# Patient Record
Sex: Male | Born: 1955
Health system: Southern US, Community
[De-identification: ages and names within clinical notes are randomized; demographics above are authoritative.]

## PROBLEM LIST (undated history)

## (undated) DIAGNOSIS — I10 Essential (primary) hypertension: Secondary | ICD-10-CM

## (undated) DIAGNOSIS — G4730 Sleep apnea, unspecified: Secondary | ICD-10-CM

## (undated) DIAGNOSIS — T4145XA Adverse effect of unspecified anesthetic, initial encounter: Secondary | ICD-10-CM

## (undated) DIAGNOSIS — M1990 Unspecified osteoarthritis, unspecified site: Secondary | ICD-10-CM

## (undated) DIAGNOSIS — B029 Zoster without complications: Secondary | ICD-10-CM

## (undated) DIAGNOSIS — G96198 Other disorders of meninges, not elsewhere classified: Secondary | ICD-10-CM

## (undated) DIAGNOSIS — J45909 Unspecified asthma, uncomplicated: Secondary | ICD-10-CM

## (undated) DIAGNOSIS — G9619 Other disorders of meninges, not elsewhere classified: Secondary | ICD-10-CM

## (undated) DIAGNOSIS — G4720 Circadian rhythm sleep disorder, unspecified type: Secondary | ICD-10-CM

## (undated) DIAGNOSIS — R112 Nausea with vomiting, unspecified: Secondary | ICD-10-CM

## (undated) DIAGNOSIS — Z9889 Other specified postprocedural states: Secondary | ICD-10-CM

## (undated) DIAGNOSIS — G039 Meningitis, unspecified: Secondary | ICD-10-CM

## (undated) DIAGNOSIS — T8859XA Other complications of anesthesia, initial encounter: Secondary | ICD-10-CM

## (undated) HISTORY — DX: Meningitis, unspecified: G03.9

## (undated) HISTORY — PX: OTHER SURGICAL HISTORY: SHX169

## (undated) HISTORY — DX: Essential (primary) hypertension: I10

## (undated) HISTORY — DX: Other disorders of meninges, not elsewhere classified: G96.19

## (undated) HISTORY — PX: JOINT REPLACEMENT: SHX530

## (undated) HISTORY — DX: Circadian rhythm sleep disorder, unspecified type: G47.20

## (undated) HISTORY — DX: Other disorders of meninges, not elsewhere classified: G96.198

## (undated) HISTORY — DX: Unspecified osteoarthritis, unspecified site: M19.90

## (undated) HISTORY — PX: FRACTURE SURGERY: SHX138

## (undated) HISTORY — DX: Zoster without complications: B02.9

---

## 2002-11-20 ENCOUNTER — Ambulatory Visit (HOSPITAL_BASED_OUTPATIENT_CLINIC_OR_DEPARTMENT_OTHER): Admission: RE | Admit: 2002-11-20 | Discharge: 2002-11-20 | Payer: Self-pay | Admitting: Orthopedic Surgery

## 2005-03-09 ENCOUNTER — Ambulatory Visit: Payer: Self-pay | Admitting: Family Medicine

## 2005-04-25 ENCOUNTER — Ambulatory Visit: Payer: Self-pay | Admitting: Family Medicine

## 2005-05-01 ENCOUNTER — Ambulatory Visit: Payer: Self-pay | Admitting: Family Medicine

## 2005-05-31 ENCOUNTER — Ambulatory Visit: Payer: Self-pay | Admitting: Internal Medicine

## 2005-06-08 ENCOUNTER — Ambulatory Visit: Payer: Self-pay | Admitting: Internal Medicine

## 2005-06-08 ENCOUNTER — Encounter (INDEPENDENT_AMBULATORY_CARE_PROVIDER_SITE_OTHER): Payer: Self-pay | Admitting: Specialist

## 2006-01-01 ENCOUNTER — Ambulatory Visit: Payer: Self-pay | Admitting: Family Medicine

## 2006-04-05 ENCOUNTER — Ambulatory Visit: Payer: Self-pay | Admitting: Family Medicine

## 2006-04-05 LAB — CONVERTED CEMR LAB
AST: 18 units/L (ref 0–37)
Albumin: 4.5 g/dL (ref 3.5–5.2)
Alkaline Phosphatase: 72 units/L (ref 39–117)
BUN: 9 mg/dL (ref 6–23)
Calcium: 9.4 mg/dL (ref 8.4–10.5)
Chol/HDL Ratio, serum: 3.9
Cholesterol: 183 mg/dL (ref 0–200)
GFR calc non Af Amer: 83 mL/min
Hemoglobin: 15 g/dL (ref 13.0–17.0)
MCHC: 33.3 g/dL (ref 30.0–36.0)
Monocytes Relative: 8.2 % (ref 3.0–11.0)
Neutrophils Relative %: 72.1 % (ref 43.0–77.0)
Platelets: 325 10*3/uL (ref 150–400)
Potassium: 4.8 meq/L (ref 3.5–5.1)
RBC: 5.07 M/uL (ref 4.22–5.81)
RDW: 12.5 % (ref 11.5–14.6)
Total Protein: 6.8 g/dL (ref 6.0–8.3)

## 2006-04-16 ENCOUNTER — Ambulatory Visit: Payer: Self-pay | Admitting: Family Medicine

## 2006-05-07 ENCOUNTER — Ambulatory Visit: Payer: Self-pay | Admitting: Family Medicine

## 2006-09-11 ENCOUNTER — Ambulatory Visit: Payer: Self-pay | Admitting: Family Medicine

## 2007-03-26 ENCOUNTER — Ambulatory Visit: Payer: Self-pay | Admitting: Family Medicine

## 2007-03-26 LAB — CONVERTED CEMR LAB
ALT: 28 units/L (ref 0–53)
AST: 23 units/L (ref 0–37)
BUN: 11 mg/dL (ref 6–23)
Basophils Absolute: 0 10*3/uL (ref 0.0–0.1)
Bilirubin, Direct: 0.2 mg/dL (ref 0.0–0.3)
CO2: 32 meq/L (ref 19–32)
Calcium: 9.2 mg/dL (ref 8.4–10.5)
Cholesterol: 216 mg/dL (ref 0–200)
Creatinine, Ser: 1 mg/dL (ref 0.4–1.5)
GFR calc non Af Amer: 83 mL/min
HDL: 45 mg/dL (ref >39.0)
Hemoglobin: 14.6 g/dL (ref 13.0–17.0)
Ketones, urine, test strip: NEGATIVE
Lymphocytes Relative: 29.6 % (ref 12.0–46.0)
Monocytes Absolute: 0.6 10*3/uL (ref 0.2–0.7)
Neutro Abs: 3.1 10*3/uL (ref 1.4–7.7)
Neutrophils Relative %: 57.3 % (ref 43.0–77.0)
Nitrite: NEGATIVE
RBC: 4.65 M/uL (ref 4.22–5.81)
Total Bilirubin: 0.7 mg/dL (ref 0.3–1.2)
Total CHOL/HDL Ratio: 4.8
VLDL: 29 mg/dL (ref 0–40)
pH: 7

## 2007-04-02 ENCOUNTER — Telehealth: Payer: Self-pay | Admitting: Family Medicine

## 2007-04-02 ENCOUNTER — Ambulatory Visit: Payer: Self-pay | Admitting: Family Medicine

## 2007-04-02 DIAGNOSIS — G4700 Insomnia, unspecified: Secondary | ICD-10-CM | POA: Insufficient documentation

## 2007-04-09 ENCOUNTER — Ambulatory Visit: Payer: Self-pay | Admitting: Family Medicine

## 2007-08-19 ENCOUNTER — Telehealth (INDEPENDENT_AMBULATORY_CARE_PROVIDER_SITE_OTHER): Payer: Self-pay | Admitting: *Deleted

## 2007-12-10 ENCOUNTER — Telehealth: Payer: Self-pay | Admitting: Family Medicine

## 2008-04-28 ENCOUNTER — Ambulatory Visit: Payer: Self-pay | Admitting: Family Medicine

## 2008-05-25 ENCOUNTER — Ambulatory Visit: Payer: Self-pay | Admitting: Family Medicine

## 2008-05-25 DIAGNOSIS — I10 Essential (primary) hypertension: Secondary | ICD-10-CM | POA: Insufficient documentation

## 2008-10-15 ENCOUNTER — Telehealth: Payer: Self-pay | Admitting: Family Medicine

## 2008-11-13 ENCOUNTER — Telehealth: Payer: Self-pay | Admitting: *Deleted

## 2009-06-21 ENCOUNTER — Ambulatory Visit: Payer: Self-pay | Admitting: Family Medicine

## 2009-06-21 LAB — CONVERTED CEMR LAB
ALT: 27 units/L (ref 0–53)
AST: 22 units/L (ref 0–37)
Albumin: 4.4 g/dL (ref 3.5–5.2)
Alkaline Phosphatase: 66 units/L (ref 39–117)
BUN: 12 mg/dL (ref 6–23)
Bilirubin Urine: NEGATIVE
Chloride: 105 meq/L (ref 96–112)
Eosinophils Absolute: 0.1 10*3/uL (ref 0.0–0.7)
Glucose, Bld: 88 mg/dL (ref 70–99)
HCT: 43.7 % (ref 39.0–52.0)
HDL: 49.7 mg/dL (ref >39.00)
Hemoglobin: 14.5 g/dL (ref 13.0–17.0)
MCV: 92.2 fL (ref 78.0–100.0)
Monocytes Absolute: 0.5 10*3/uL (ref 0.1–1.0)
Neutrophils Relative %: 62.2 % (ref 43.0–77.0)
PSA: 1.06 ng/mL (ref 0.10–4.00)
Protein, U semiquant: NEGATIVE
RBC: 4.74 M/uL (ref 4.22–5.81)
Specific Gravity, Urine: 1.02
TSH: 0.92 microintl units/mL (ref 0.35–5.50)
Total Bilirubin: 1 mg/dL (ref 0.3–1.2)
Total CHOL/HDL Ratio: 4
Total Protein: 7.1 g/dL (ref 6.0–8.3)
Triglycerides: 157 mg/dL — ABNORMAL HIGH (ref 0.0–149.0)
VLDL: 31.4 mg/dL (ref 0.0–40.0)
pH: 6

## 2009-07-28 ENCOUNTER — Encounter (HOSPITAL_COMMUNITY): Admission: RE | Admit: 2009-07-28 | Discharge: 2009-09-28 | Payer: Self-pay | Admitting: Neurological Surgery

## 2009-08-03 ENCOUNTER — Ambulatory Visit: Payer: Self-pay | Admitting: Family Medicine

## 2009-08-20 ENCOUNTER — Ambulatory Visit: Payer: Self-pay | Admitting: Family Medicine

## 2009-08-25 ENCOUNTER — Telehealth: Payer: Self-pay | Admitting: Family Medicine

## 2009-09-10 ENCOUNTER — Telehealth: Payer: Self-pay | Admitting: Family Medicine

## 2010-02-24 ENCOUNTER — Telehealth: Payer: Self-pay | Admitting: Family Medicine

## 2010-05-02 ENCOUNTER — Ambulatory Visit: Payer: Self-pay | Admitting: Family Medicine

## 2010-06-07 ENCOUNTER — Encounter: Payer: Self-pay | Admitting: Internal Medicine

## 2010-06-28 NOTE — Progress Notes (Signed)
Summary: celexa refill  Phone Note Refill Request Message from:  Fax from Pharmacy on February 24, 2010 3:56 PM  Refills Requested: Medication #1:  CELEXA 20 MG  TABS take one and a half tablet daily Initial call taken by: Kern Reap CMA Duncan Dull),  February 24, 2010 3:56 PM    Prescriptions: CELEXA 20 MG  TABS (CITALOPRAM HYDROBROMIDE) take one and a half tablet daily  #100 x 2   Entered by:   Kern Reap CMA (AAMA)   Authorized by:   Roderick Pee MD   Signed by:   Kern Reap CMA (AAMA) on 02/24/2010   Method used:   Electronically to        [facility]  [phone]* (retail)       [address]       [place], [place]  [address]       Ph: [phone] or [phone]       Fax: [phone]   RxID:   [id number]

## 2010-06-28 NOTE — Assessment & Plan Note (Signed)
Summary: MED CHECK/REFILL/CJR   Vital Signs:  Patient profile:   55 year old male Height:      69.25 inches Weight:      208 pounds BMI:     30.61 Temp:     98.8 degrees F oral BP sitting:   110 / 80  (left arm) Cuff size:   regular  Vitals Entered By: Kern Reap CMA Duncan Dull) (June 21, 2009 11:32 AM)  Reason for Visit follow up meds  History of Present Illness: John Bowen is a 55 year old male, who comes in today for evaluation of back pain.  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  He had 3 compression fractures in his thoracic spine.  At the time he was evaluated at [facility].  A couple years ago he began having upper back pain.  Over the last year.  It is gotten worse.  He had an evaluation in [facility] office.  Plain x-rays were normal and he was prescribed to physical therapy.  The physical therapy did not help.  Therefore, he went back for follow-up.  He had an MRI, which showed no evidence of any disk problems.  However, he has some benign hemangiomas in that area.  He was told that they were benign and not the cause of his discomfort.  He went to see a, chiropractor who's been doing stretching etc. without relief.  He describes the pain is intermittent.  It is a dull ache sometimes on a scale of one to 10.  It is 7 or 8 no radiation.  Neurologic review of systems otherwise negative  Allergies (verified): No Known Drug Allergies  Past History:  Past medical, surgical, family and social histories (including risk factors) reviewed, and no changes noted (except as noted below).  Past Medical History: Reviewed history from 05/25/2008 and no changes required. sleep dysfunction degenerative joint disease right left knee meningitis 1995 Hypertension  Family History: Reviewed history from 04/02/2007 and no changes required. Family History Hypertension  Social History: Reviewed history from 04/02/2007 and no changes required. Married Never Smoked Alcohol  use-yes Drug use-no Regular exercise-yes  Review of Systems      See HPI  Physical Exam  General:  Well-developed,well-nourished,in no acute distress; alert,appropriate and cooperative throughout examination   Impression & Recommendations:  Problem # 1:  BACK PAIN, THORACIC REGION (ICD-724.1) Assessment New  Orders: Venipuncture (54098) TLB-Lipid Panel (80061-LIPID) TLB-BMP (Basic Metabolic Panel-BMET) (80048-METABOL) TLB-CBC Platelet - w/Differential (85025-CBCD) TLB-Hepatic/Liver Function Pnl (80076-HEPATIC) TLB-TSH (Thyroid Stimulating Hormone) (84443-TSH) TLB-PSA (Prostate Specific Antigen) (84153-PSA) Neurosurgeon Referral (Neurosurgeon)  Complete Medication List: 1)  Celexa 20 Mg Tabs (Citalopram hydrobromide) .... Take one and a half tablet daily 2)  Zestril 20 Mg Tabs (Lisinopril) .... Take 1 tablet by mouth every morning  Other Orders: UA Dipstick w/o Micro (automated)  (81003)  Patient Instructions: 1)  begin Motrin 600 mg twice a day with food. 2)  I will request a formal consult with Dr. Barnett Abu neurosurgeon 3)  after your lab work set up an appointment sometime in the next two to 4 weeks for 30 minutes for you general medical exam Prescriptions: ZESTRIL 20 MG TABS (LISINOPRIL) Take 1 tablet by mouth every morning  #100 x 3   Entered and Authorized by:   Roderick Pee MD   Signed by:   Roderick Pee MD on 06/21/2009   Method used:   Print then Give to Patient   RxID:   [id number] CELEXA 20 MG  TABS (CITALOPRAM  HYDROBROMIDE) take one and a half tablet daily  #100 x 3   Entered and Authorized by:   Roderick Pee MD   Signed by:   Roderick Pee MD on 06/21/2009   Method used:   Print then Give to Patient   RxID:   [id number]   Laboratory Results   Urine Tests    Routine Urinalysis   Color: yellow Appearance: Clear Glucose: negative   (Normal Range: Negative) Bilirubin: negative   (Normal Range: Negative) Ketone: negative    (Normal Range: Negative) Spec. Gravity: 1.020   (Normal Range: 1.003-1.035) Blood: negative   (Normal Range: Negative) pH: 6.0   (Normal Range: 5.0-8.0) Protein: negative   (Normal Range: Negative) Urobilinogen: 0.2   (Normal Range: 0-1) Nitrite: negative   (Normal Range: Negative) Leukocyte Esterace: negative   (Normal Range: Negative)    Comments: Rita Ohara  June 21, 2009 1:38 PM

## 2010-06-28 NOTE — Assessment & Plan Note (Signed)
Summary: FLU-LIKE SXS // RS   Vital Signs:  Patient profile:   55 year old male Weight:      202 pounds BMI:     29.72 Temp:     99.3 degrees F oral BP sitting:   128 / 100  (left arm) Cuff size:   regular  Vitals Entered By: Raechel Ache, RN (August 20, 2009 1:08 PM) CC: C/o sick x 1 week. Burned mouth 1 week ago on hot food- mouth ulcerated, sore throat, ears and eyes full, prod cough and muscles sore from coughing.   History of Present Illness: Here for one week of sinus prssure, HA, PND, ST, dry coughing, and low grade fevers. On fluids.   Allergies (verified): No Known Drug Allergies  Past History:  Past Medical History: Reviewed history from 05/25/2008 and no changes required. sleep dysfunction degenerative joint disease right left knee meningitis 1995 Hypertension  Review of Systems  The patient denies anorexia, weight loss, weight gain, vision loss, decreased hearing, hoarseness, chest pain, syncope, dyspnea on exertion, peripheral edema, hemoptysis, abdominal pain, melena, hematochezia, severe indigestion/heartburn, hematuria, incontinence, genital sores, muscle weakness, suspicious skin lesions, transient blindness, difficulty walking, depression, unusual weight change, abnormal bleeding, enlarged lymph nodes, angioedema, breast masses, and testicular masses.    Physical Exam  General:  Well-developed,well-nourished,in no acute distress; alert,appropriate and cooperative throughout examination Head:  Normocephalic and atraumatic without obvious abnormalities. No apparent alopecia or balding. Eyes:  No corneal or conjunctival inflammation noted. EOMI. Perrla. Funduscopic exam benign, without hemorrhages, exudates or papilledema. Vision grossly normal. Ears:  External ear exam shows no significant lesions or deformities.  Otoscopic examination reveals clear canals, tympanic membranes are intact bilaterally without bulging, retraction, inflammation or discharge.  Hearing is grossly normal bilaterally. Nose:  External nasal examination shows no deformity or inflammation. Nasal mucosa are pink and moist without lesions or exudates. Mouth:  Oral mucosa and oropharynx without lesions or exudates.  Teeth in good repair. Neck:  No deformities, masses, or tenderness noted. Lungs:  Normal respiratory effort, chest expands symmetrically. Lungs are clear to auscultation, no crackles or wheezes.   Impression & Recommendations:  Problem # 1:  ACUTE SINUSITIS, UNSPECIFIED (ICD-461.9)  His updated medication list for this problem includes:    Zithromax Z-pak 250 Mg Tabs (Azithromycin) .Marland Kitchen... As directed    Hydromet 5-1.5 Mg/2ml Syrp (Hydrocodone-homatropine) .Marland Kitchen... 1 tsp q 4 hours as needed cough  Complete Medication List: 1)  Celexa 20 Mg Tabs (Citalopram hydrobromide) .... Take one and a half tablet daily 2)  Zestril 20 Mg Tabs (Lisinopril) .... Take 1 tablet by mouth every morning 3)  Zithromax Z-pak 250 Mg Tabs (Azithromycin) .... As directed 4)  Hydromet 5-1.5 Mg/41ml Syrp (Hydrocodone-homatropine) .Marland Kitchen.. 1 tsp q 4 hours as needed cough  Patient Instructions: 1)  Please schedule a follow-up appointment as needed .  Prescriptions: HYDROMET 5-1.5 MG/5ML SYRP (HYDROCODONE-HOMATROPINE) 1 tsp q 4 hours as needed cough  #240 x 0   Entered and Authorized by:   Nelwyn Salisbury MD   Signed by:   Nelwyn Salisbury MD on 08/20/2009   Method used:   Print then Give to Patient   RxID:   [id number] ZITHROMAX Z-PAK 250 MG TABS (AZITHROMYCIN) as directed  #1 x 0   Entered and Authorized by:   Nelwyn Salisbury MD   Signed by:   Nelwyn Salisbury MD on 08/20/2009   Method used:   Print then Give to Patient   RxID:  [id number]  

## 2010-06-28 NOTE — Assessment & Plan Note (Signed)
Summary: 4-5 wk follow up/cjr/pt rsc/cjr   Vital Signs:  Patient profile:   55 year old male Weight:      207 pounds Temp:     99.4 degrees F oral BP sitting:   130 / 80  (left arm) Cuff size:   regular  Vitals Entered By: Kern Reap CMA Duncan Dull) (August 03, 2009 3:27 PM)  Reason for Visit follow up office visit  History of Present Illness: Jesusita Oka is a 55 year old, married male, nonsmoker comes back today for follow-up of mood changes, hypertension, back pain.  He is on Celexa 30 mg nightly sleeping well.  Would like to continue that medication.  He also takes Zestril 20 mg daily BP 130/80.  Mild cough, which is not to change his medicine.  He went to see Dr. Danielle Dess for evaluation of the hemangiomas in his back.  They did scan and he stated he go back for follow-up on the 18th of March.  Allergies (verified): No Known Drug Allergies  Past History:  Past medical, surgical, family and social histories (including risk factors) reviewed for relevance to current acute and chronic problems.  Past Medical History: Reviewed history from 05/25/2008 and no changes required. sleep dysfunction degenerative joint disease right left knee meningitis 1995 Hypertension  Family History: Reviewed history from 04/02/2007 and no changes required. Family History Hypertension  Social History: Reviewed history from 04/02/2007 and no changes required. Married Never Smoked Alcohol use-yes Drug use-no Regular exercise-yes  Review of Systems      See HPI  Physical Exam  General:  Well-developed,well-nourished,in no acute distress; alert,appropriate and cooperative throughout examination Psych:  Cognition and judgment appear intact. Alert and cooperative with normal attention span and concentration. No apparent delusions, illusions, hallucinations   Impression & Recommendations:  Problem # 1:  HYPERTENSION (ICD-401.9) Assessment Improved  His updated medication list for this problem  includes:    Zestril 20 Mg Tabs (Lisinopril) .Marland Kitchen... 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 Take one and a half tablet daily 2)  Zestril 20 Mg Tabs (Lisinopril) .... Take 1 tablet by mouth every morning  Patient Instructions: 1)  continue current medication.  Return for your annual physical examination when you are due

## 2010-06-28 NOTE — Assessment & Plan Note (Signed)
Summary: flu/fever, cough , mylagias/dm   Vital Signs:  Patient profile:   55 year old male Weight:      209 pounds Temp:     98.7 degrees F oral BP sitting:   130 / 90  (left arm) Cuff size:   regular  Vitals Entered By: Kern Reap CMA Duncan Dull) (May 02, 2010 5:20 PM) CC: fever, body aches, chills, congestion, cough   CC:  fever, body aches, chills, congestion, and cough.  History of Present Illness: John Bowen is a 55 year old male, who comes in today for evaluation of a 3 day history of fever, chills, aching all over, and cough.  Review of systems negative.  He did not get the flu shot this year  Allergies: No Known Drug Allergies  Past History:  Past medical, surgical, family and social histories (including risk factors) reviewed, and no changes noted (except as noted below). Past medical, surgical, family and social histories (including risk factors) reviewed for relevance to current acute and chronic problems.  Past Medical History: Reviewed history from 05/25/2008 and no changes required. sleep dysfunction degenerative joint disease right left knee meningitis 1995 Hypertension  Family History: Reviewed history from 04/02/2007 and no changes required. Family History Hypertension  Social History: Reviewed history from 04/02/2007 and no changes required. Married Never Smoked Alcohol use-yes Drug use-no Regular exercise-yes  Review of Systems      See HPI  Physical Exam  General:  Well-developed,well-nourished,in no acute distress; alert,appropriate and cooperative throughout examination Head:  Normocephalic and atraumatic without obvious abnormalities. No apparent alopecia or balding. Eyes:  No corneal or conjunctival inflammation noted. EOMI. Perrla. Funduscopic exam benign, without hemorrhages, exudates or papilledema. Vision grossly normal. Ears:  External ear exam shows no significant lesions or deformities.  Otoscopic examination reveals clear canals,  tympanic membranes are intact bilaterally without bulging, retraction, inflammation or discharge. Hearing is grossly normal bilaterally. Nose:  External nasal examination shows no deformity or inflammation. Nasal mucosa are pink and moist without lesions or exudates. Mouth:  Oral mucosa and oropharynx without lesions or exudates.  Teeth in good repair. Neck:  No deformities, masses, or tenderness noted. Breasts:  No masses or gynecomastia noted Lungs:  Normal respiratory effort, chest expands symmetrically. Lungs are clear to auscultation, no crackles or wheezes.   Problems:  Medical Problems Added: 1)  Dx of Influenza, With Respiratory Symptoms  (ICD-487.1)  Impression & Recommendations:  Problem # 1:  INFLUENZA, WITH RESPIRATORY SYMPTOMS (ICD-487.1) Assessment New  Complete Medication List: 1)  Celexa 20 Mg Tabs (Citalopram hydrobromide) .... Take one and a half tablet daily 2)  Zestril 20 Mg Tabs (Lisinopril) .... Take 1 tablet by mouth every morning 3)  Hydromet 5-1.5 Mg/75ml Syrp (Hydrocodone-homatropine) .Marland Kitchen.. 1 tsp q 4 hours as needed cough 4)  Hydromet 5-1.5 Mg/72ml Syrp (Hydrocodone-homatropine) .Marland Kitchen.. 1or 2 tsps three times a day as needed  Patient Instructions: 1)  drink lots of water, 2)  Vaporizer or humidifier in y  bedroom at night, Hydromet one to 2 teaspoons 3 times a day for cough.  Return p.r.n. Prescriptions: HYDROMET 5-1.5 MG/5ML SYRP (HYDROCODONE-HOMATROPINE) 1or 2 tsps three times a day as needed  #8oz x 1   Entered and Authorized by:   Roderick Pee MD   Signed by:   Roderick Pee MD on 05/02/2010   Method used:   Print then Give to Patient   RxID:   [id number]    Orders Added: 1)  Est. Patient Level III [20254]

## 2010-06-28 NOTE — Progress Notes (Signed)
Summary: pt req refill for antibiotic  Phone Note Call from Patient Call back at [phone] cell   Caller: Patient Summary of Call: Pt req refill of antibiotic. pls call in to [facility] on [address].  Initial call taken by: Lucy Antigua,  August 25, 2009 11:07 AM  Follow-up for Phone Call        since he is not well no need to see me tomorrow for an office visit Follow-up by: Roderick Pee MD,  August 25, 2009 11:41 AM  Additional Follow-up for Phone Call Additional follow up Details #1::        patient is aware and will call back for an appointment Additional Follow-up by: Kern Reap CMA Duncan Dull),  August 25, 2009 11:51 AM

## 2010-06-28 NOTE — Progress Notes (Signed)
Summary: food poisoning  Phone Note Call from Patient   Caller: Patient Call For: John Pee MD Summary of Call: Pt. ate at [facility] last night and drove to [facility].  Started with diarrhea and vomiting last night.  Some better today with gatoraid, and no vomiting or diarrhea right now.  No fever.  [phone]  Pharmacy Initial call taken by: Lynann Beaver CMA,  September 10, 2009 12:46 PM  Follow-up for Phone Call        Phenergan suppositories 25 mg number 3........Marland Kitchen1 every 8 hours as needed for nausea and vomiting,,,,,,,,,,however if no nausea, and vomiting.  The Phenergan is not to help, please call Follow-up by: John Pee MD,  September 10, 2009 1:32 PM  Additional Follow-up for Phone Call Additional follow up Details #1::        Phone Call Completed, Rx Called In Additional Follow-up by: Kern Reap CMA Duncan Dull),  September 10, 2009 2:20 PM

## 2010-06-30 NOTE — Letter (Signed)
Summary: Colonoscopy Date Change Letter  [facility]  [address] [place], [place] [address]   Phone: [phone]  Fax: [phone]      June 07, 2010 MRN: [id number]   MAHMOOD BOEHRINGER [address] [place], [place]  [address]   Dear Mr. Cheri Fowler,   Previously you were recommended to have a repeat colonoscopy around this time. Your chart was recently reviewed by Dr. Lina Sar of [facility]. Follow up colonoscopy is now recommended in January 2014. This revised recommendation is based on current, nationally recognized guidelines for colorectal cancer screening and polyp surveillance. These guidelines are endorsed by the American Cancer Society, [employer] on Colorectal Cancer as well as numerous other major medical organizations.  Please understand that our recommendation assumes that you do not have any new symptoms such as bleeding, a change in bowel habits, anemia, or significant abdominal discomfort. If you do have any concerning GI symptoms or want to discuss the guideline recommendations, please call to arrange an office visit at your earliest convenience. Otherwise we will keep you in our reminder system and contact you 1-2 months prior to the date listed above to schedule your next colonoscopy.  Thank you,  Hedwig Morton. Juanda Chance, M.D. [facility] [phone]

## 2010-07-04 ENCOUNTER — Encounter: Payer: Self-pay | Admitting: Family Medicine

## 2010-07-04 ENCOUNTER — Telehealth: Payer: Self-pay | Admitting: *Deleted

## 2010-07-04 ENCOUNTER — Ambulatory Visit (INDEPENDENT_AMBULATORY_CARE_PROVIDER_SITE_OTHER): Payer: 59 | Admitting: Family Medicine

## 2010-07-04 VITALS — BP 120/80 | Temp 98.1°F | Ht 69.5 in | Wt 204.0 lb

## 2010-07-04 DIAGNOSIS — K645 Perianal venous thrombosis: Secondary | ICD-10-CM

## 2010-07-04 MED ORDER — HYDROCORTISONE 2.5 % RE CREA: TOPICAL_CREAM | RECTAL | Status: DC

## 2010-07-04 NOTE — Telephone Encounter (Signed)
Appt scheduled for pt as he is not sure if he has a hernia or hemorrhoids.

## 2010-07-04 NOTE — Patient Instructions (Signed)
Sit in a hot tub of water and soaked for 15 minutes twice daily.  Applied.  The medicated ointment twice daily.  Drink lots of water and take milk of Magnesia or prune juice twice daily.  Return Friday for definitive treatment p.r.n.

## 2010-07-04 NOTE — Progress Notes (Signed)
  Subjective:    Patient ID: John Bowen, male    DOB: 12-17-1955, 55 y.o.   MRN: [id number]  HPI  John Bowen Is a 55 year old, married male, nonsmoker, who comes in today for evaluation of rectal pain x 8 days.  About 8 days after we played golf.  He noticed some pain in his rectum.  Over the course of last 8 days.  The pain has gotten worse.  He describes it as a 45 on a scale of one to 10.  He's also noticed a lump around his rectum.  He's never had problems like this in the past.  No history of constipation Review of Systems Negative    Objective:   Physical Exam Well-developed well-nourished, male in no acute distress.  Examination of rectum shows a golf ball sized thrombosed hemorrhoid       Assessment & Plan:  Thrombosed external hemorrhoid.  Plan we discussed options.  Patient likes to try hot soaks stool softeners, medicated ointment for 3 or 4 days if it does not resolve.  By Friday.  He is to call and we will do an I&D

## 2010-07-05 ENCOUNTER — Ambulatory Visit: Payer: Self-pay | Admitting: Family Medicine

## 2010-08-29 ENCOUNTER — Other Ambulatory Visit: Payer: Self-pay | Admitting: *Deleted

## 2010-08-29 MED ORDER — CITALOPRAM HYDROBROMIDE 20 MG PO TABS: ORAL_TABLET | ORAL | Status: DC

## 2010-09-07 ENCOUNTER — Other Ambulatory Visit: Payer: Self-pay | Admitting: Family Medicine

## 2010-09-15 ENCOUNTER — Other Ambulatory Visit: Payer: Self-pay | Admitting: *Deleted

## 2010-09-15 MED ORDER — LISINOPRIL 20 MG PO TABS: 20.0000 mg | ORAL_TABLET | Freq: Every day | ORAL | Status: DC

## 2010-10-14 NOTE — Op Note (Signed)
   NAME:  John Bowen, John Bowen NO.:  [id number]   MEDICAL RECORD NO.:  [id number]                   PATIENT TYPE:  AMB   LOCATION:  [facility]                                  FACILITY:  [facility]   PHYSICIAN:  Loreta Ave, M.D.              DATE OF BIRTH:  [birth year]   DATE OF PROCEDURE:  11/20/2002  DATE OF DISCHARGE:                                 OPERATIVE REPORT   PREOPERATIVE DIAGNOSIS:  Medial meniscus tear, right knee.   POSTOPERATIVE DIAGNOSIS:  Medial meniscus tear, right knee.   OPERATIVE PROCEDURE:  Right knee examination under anesthesia, arthroscopy,  partial medial meniscectomy.   SURGEON:  Loreta Ave, M.D.   ASSISTANT:  Arlys John D. Petrarca, P.A.-C.   ANESTHESIA:  Knee block with sedation.   SPECIMENS:  None.   CULTURES:  None.   COMPLICATIONS:  None.   DRESSING:  Soft compressive.   PROCEDURE:  Patient brought to the operating room and placed on the  operating table in supine position.  After adequate anesthesia had been  obtained, right knee examined.  Full motion and good stability.  Some mild  patellofemoral crepitus, not marked.  Tourniquet and leg holder applied, leg  prepped and draped in the usual sterile fashion.  Three portals created, one  superolateral, one each medial and lateral parapatellar.  Inflow catheter  introduced, the knee distended, arthroscope introduced,  the knee inspected.  Some mild grade 2 chondromalacia of the patella debrided.  Very good  tracking.  No other chondral changes.  Cruciate ligaments intact.  Lateral  meniscus intact throughout.  Medial meniscus irreparable complex tearing,  posterior half of the portion was folded over on top of itself.  Posterior  half removed, tapered into remaining meniscus.  All other recess examined,  no other findings appreciated.  Instruments and fluid removed.  Portals and  knee injected with Marcaine.  Portals closed with 4-0 Monocryl.  A sterile  compressive  dressing applied.  Anesthesia reversed.  Brought to the recovery  room.  Tolerated the surgery well with no complications.                                               Loreta Ave, M.D.    DFM/MEDQ  D:  11/20/2002  T:  11/22/2002  Job:  [id number]

## 2010-11-13 ENCOUNTER — Other Ambulatory Visit: Payer: Self-pay | Admitting: Family Medicine

## 2011-03-20 ENCOUNTER — Encounter: Payer: Self-pay | Admitting: Family Medicine

## 2011-03-20 ENCOUNTER — Ambulatory Visit (INDEPENDENT_AMBULATORY_CARE_PROVIDER_SITE_OTHER): Payer: 59 | Admitting: Family Medicine

## 2011-03-20 VITALS — BP 130/84 | Temp 98.4°F | Wt 199.0 lb

## 2011-03-20 DIAGNOSIS — M7520 Bicipital tendinitis, unspecified shoulder: Secondary | ICD-10-CM

## 2011-03-20 DIAGNOSIS — L659 Nonscarring hair loss, unspecified: Secondary | ICD-10-CM

## 2011-03-20 DIAGNOSIS — Z23 Encounter for immunization: Secondary | ICD-10-CM

## 2011-03-20 DIAGNOSIS — M79609 Pain in unspecified limb: Secondary | ICD-10-CM

## 2011-03-20 DIAGNOSIS — M79644 Pain in right finger(s): Secondary | ICD-10-CM

## 2011-03-20 LAB — TESTOSTERONE: Testosterone: 239.9 ng/dL — ABNORMAL LOW (ref 350.00–890.00)

## 2011-03-20 LAB — T4, FREE: Free T4: 1.11 ng/dL (ref 0.60–1.60)

## 2011-03-20 MED ORDER — PREDNISONE 20 MG PO TABS: ORAL_TABLET | ORAL | Status: DC

## 2011-03-20 NOTE — Patient Instructions (Signed)
Stop the Relafen.  Prednisone two tabs x 3 days and taper as directed.  I will call you when I get y  lab work back

## 2011-03-20 NOTE — Progress Notes (Signed)
  Subjective:    Patient ID: John Bowen, male    DOB: 02/28/1956, 55 y.o.   MRN: [id number]  John Bowen is a 55 year old male, married, nonsmoker, who comes in today for evaluation of 3 problems.  He has always had some mild osteoarthritis for which he takes Relafen 500 mg b.i.d. However, in the past two, months.  She's been doing a lot of work with his right hand.  He said the fourth and fifth fingers on his right knee and developed a trigger finger syndrome.  The joints have been swollen, but not red or hot.  He also has pain in his right elbow.  He also has hair loss for the past 6 months etiology unknown.  It does not run in his family.  He is going on a golfing trip to [place] in 3 day    Review of Systems General orthopedic dermatologic review of systems otherwise negative    Objective:   Physical Exam Well-developed well-nourished man no acute distress.  Examination of right elbow.  She has pain at the insertion of the tendon consistent with tennis elbow.  He also has swelling of his middle joints.  No erythema.  Wrist is normal.  No focal hair loss of the scalp       Assessment & Plan:  Joint pain, right hand, check labs.  Tennis elbow, right elbow, prednisone burst and taper splinting.  Hair loss.  Check labs

## 2011-03-21 ENCOUNTER — Ambulatory Visit: Payer: 59 | Admitting: Family Medicine

## 2011-03-21 LAB — T3, FREE: T3, Free: 2.6 pg/mL (ref 2.3–4.2)

## 2011-03-21 LAB — ANTI-DNA ANTIBODY, DOUBLE-STRANDED: ds DNA Ab: 1 IU/mL (ref <30)

## 2011-03-22 ENCOUNTER — Other Ambulatory Visit: Payer: Self-pay | Admitting: Family Medicine

## 2011-03-30 ENCOUNTER — Other Ambulatory Visit (INDEPENDENT_AMBULATORY_CARE_PROVIDER_SITE_OTHER): Payer: BC Managed Care – PPO

## 2011-03-30 DIAGNOSIS — E291 Testicular hypofunction: Secondary | ICD-10-CM

## 2011-04-05 ENCOUNTER — Telehealth: Payer: Self-pay | Admitting: Family Medicine

## 2011-04-05 NOTE — Telephone Encounter (Signed)
Requesting his lab results. Thanks. °

## 2011-04-06 NOTE — Telephone Encounter (Signed)
patient  Is aware of lab results 

## 2011-04-11 ENCOUNTER — Other Ambulatory Visit (INDEPENDENT_AMBULATORY_CARE_PROVIDER_SITE_OTHER): Payer: BC Managed Care – PPO

## 2011-04-11 DIAGNOSIS — E349 Endocrine disorder, unspecified: Secondary | ICD-10-CM

## 2011-04-11 DIAGNOSIS — E291 Testicular hypofunction: Secondary | ICD-10-CM

## 2011-04-17 ENCOUNTER — Telehealth: Payer: Self-pay | Admitting: *Deleted

## 2011-04-17 NOTE — Telephone Encounter (Signed)
We need to sit down and make an appointment to talk about what the options are

## 2011-04-17 NOTE — Telephone Encounter (Signed)
Patient is calling to see if he needs to take medication because of his testosterone levels?

## 2011-04-18 NOTE — Telephone Encounter (Signed)
Patient is aware and appointment made. 

## 2011-04-24 ENCOUNTER — Telehealth: Payer: Self-pay | Admitting: Family Medicine

## 2011-04-24 NOTE — Telephone Encounter (Signed)
Left message on machine for returning patient's call

## 2011-04-24 NOTE — Telephone Encounter (Signed)
Has a sinus infection. Would like for Fleet Contras to return call. Patient knows that Dr Tawanna Cooler is out this week. Patient refused ov later this week, when I offered. Thanks.

## 2011-05-11 ENCOUNTER — Other Ambulatory Visit: Payer: Self-pay | Admitting: Family Medicine

## 2011-05-15 ENCOUNTER — Encounter: Payer: Self-pay | Admitting: Family Medicine

## 2011-05-15 ENCOUNTER — Ambulatory Visit (INDEPENDENT_AMBULATORY_CARE_PROVIDER_SITE_OTHER): Payer: BC Managed Care – PPO | Admitting: Family Medicine

## 2011-05-15 DIAGNOSIS — M79609 Pain in unspecified limb: Secondary | ICD-10-CM

## 2011-05-15 DIAGNOSIS — D1800 Hemangioma unspecified site: Secondary | ICD-10-CM

## 2011-05-15 DIAGNOSIS — E291 Testicular hypofunction: Secondary | ICD-10-CM

## 2011-05-15 DIAGNOSIS — M79644 Pain in right finger(s): Secondary | ICD-10-CM

## 2011-05-15 DIAGNOSIS — L659 Nonscarring hair loss, unspecified: Secondary | ICD-10-CM

## 2011-05-15 MED ORDER — CLOMIPHENE CITRATE 50 MG PO TABS: 50.0000 mg | ORAL_TABLET | Freq: Every day | ORAL | Status: AC

## 2011-05-15 NOTE — Progress Notes (Signed)
  Subjective:    Patient ID: John Bowen, male    DOB: 01/29/1956, 55 y.o.   MRN: [id number]  John Bowen is a 55 year old, married male, nonsmoker, who comes in today for evaluation of hair loss, and fatigue, low testosterone and a bleeding lesion on the right side of his neck.  We have monitored.  His testosterone levels over the last couple months, and they are indeed low 174.  Also, his prolactin level was normal 6.2.  We discussed various options from doing nothing two injections to testosterone gel to Clomid.  He would like to try the Clomid.  He states he has a lesion on the right side of his neck that when he shaves.  He bleeds.  Further inspection shows it to be a capillary hemangioma.    Review of Systems    General and endocrine,  systems negative Objective:   Physical Exam Well-developed well-nourished man in no acute distress.  Examination.  Next is a 5-mm times 5-mm elevated lesion consistent with a capillary hemangioma.  Because of its location and the fact that his bleeding.  We took him to the treatment room and after informed consent, the lesion was anesthetized with 1% Xylocaine with epinephrine.  The capillary hemangioma was excised.  The base was cauterized.  A Band-Aid was applied       Assessment & Plan:  Hypogonadal ......... Began Clomid one daily follow-up in 6 months.  Capillary hemangioma.........Marland Kitchen Removed see above

## 2011-05-15 NOTE — Patient Instructions (Signed)
Begin the Clomid, one tablet daily.  Return in 6 weeks for follow-up, sooner if any problem

## 2011-06-27 ENCOUNTER — Ambulatory Visit (INDEPENDENT_AMBULATORY_CARE_PROVIDER_SITE_OTHER): Payer: BC Managed Care – PPO | Admitting: Family Medicine

## 2011-06-27 ENCOUNTER — Encounter: Payer: Self-pay | Admitting: Family Medicine

## 2011-06-27 DIAGNOSIS — E291 Testicular hypofunction: Secondary | ICD-10-CM

## 2011-06-27 DIAGNOSIS — L659 Nonscarring hair loss, unspecified: Secondary | ICD-10-CM

## 2011-06-27 MED ORDER — CLOMIPHENE CITRATE 50 MG PO TABS: 50.0000 mg | ORAL_TABLET | Freq: Every day | ORAL | Status: DC

## 2011-06-27 NOTE — Progress Notes (Signed)
  Subjective:    Patient ID: John Bowen, male    DOB: 06-06-1955, 56 y.o.   MRN: [id number]  HPI John Bowen is a 56 year old married male nonsmoker who comes in today for followup of hypergonadism  We saw him a couple months ago with symptoms of fatigue no energy hair loss decreased libido. Workup showed a low testosterone level in the 170 range with a normal prolactin level. We talked about various therapeutic options. We started Clomid 50 mg daily and he comes back today for followup.  He states he feels better much improved energy except for some post prandial with lethargy. No side effects to medication   Review of Systems    general and metabolic review of systems otherwise negative Objective:   Physical Exam Well-developed well-nourished male in no acute distress       Assessment & Plan:  Hypogonadism symptoms improve with Clomid 50 mg daily plan continue current dose followup in 1 year

## 2011-06-27 NOTE — Patient Instructions (Signed)
Continue your current medications  Followup in December 2013 for your annual exam

## 2011-06-30 ENCOUNTER — Other Ambulatory Visit: Payer: Self-pay | Admitting: Family Medicine

## 2011-09-01 ENCOUNTER — Ambulatory Visit (INDEPENDENT_AMBULATORY_CARE_PROVIDER_SITE_OTHER): Payer: BC Managed Care – PPO | Admitting: Internal Medicine

## 2011-09-01 ENCOUNTER — Encounter: Payer: Self-pay | Admitting: Internal Medicine

## 2011-09-01 VITALS — BP 120/82 | HR 100 | Temp 99.6°F | Wt 205.0 lb

## 2011-09-01 DIAGNOSIS — I10 Essential (primary) hypertension: Secondary | ICD-10-CM

## 2011-09-01 DIAGNOSIS — R059 Cough, unspecified: Secondary | ICD-10-CM

## 2011-09-01 DIAGNOSIS — R05 Cough: Secondary | ICD-10-CM

## 2011-09-01 NOTE — Patient Instructions (Addendum)
This is probably a viral resp infection  andmay need to run its course.  contact us if high fever  Shortness of breath.   Or not improving in another 10 days or so.   Can try an antihistamine in case there is allergy involved.  Cough, Adult  A cough is a reflex that helps clear your throat and airways. It can help heal the body or may be a reaction to an irritated airway. A cough may only last 2 or 3 weeks (acute) or may last more than 8 weeks (chronic).  CAUSES Acute cough:  Viral or bacterial infections.  Chronic cough:  Infections.   Allergies.   Asthma.   Post-nasal drip.   Smoking.   Heartburn or acid reflux.   Some medicines.   Chronic lung problems (COPD).   Cancer.  SYMPTOMS   Cough.   Fever.   Chest pain.   Increased breathing rate.   High-pitched whistling sound when breathing (wheezing).   Colored mucus that you cough up (sputum).  TREATMENT   A bacterial cough may be treated with antibiotic medicine.   A viral cough must run its course and will not respond to antibiotics.   Your caregiver may recommend other treatments if you have a chronic cough.  HOME CARE INSTRUCTIONS   Only take over-the-counter or prescription medicines for pain, discomfort, or fever as directed by your caregiver. Use cough suppressants only as directed by your caregiver.   Use a cold steam vaporizer or humidifier in your bedroom or home to help loosen secretions.   Sleep in a semi-upright position if your cough is worse at night.   Rest as needed.   Stop smoking if you smoke.  SEEK IMMEDIATE MEDICAL CARE IF:   You have pus in your sputum.   Your cough starts to worsen.   You cannot control your cough with suppressants and are losing sleep.   You begin coughing up blood.   You have difficulty breathing.   You develop pain which is getting worse or is uncontrolled with medicine.   You have a fever.  MAKE SURE YOU:   Understand these instructions.   Will  watch your condition.   Will get help right away if you are not doing well or get worse.  Document Released: 11/11/2010 Document Revised: 05/04/2011 Document Reviewed: 11/11/2010 [facility] Patient Information 2012 [place], [place].

## 2011-09-01 NOTE — Progress Notes (Signed)
  Subjective:    Patient ID: John Bowen, male    DOB: 08-Sep-1955, 56 y.o.   MRN: [id number]  HPI Patient comes in today for SDA for  new problem evaluation. Tickle cough for a few days and now coughing up phelgm and now checking out  to get her opinion. He is using coughing drops to try to suppress the cough. He doesn't feel that bad no fever swollen glands shortness of breath or hemoptysis. There are some people sick at work. No hx of asthma lung disease e. No tobacco  No fever   .  Feels fine otherwise.   He is on an ACE inhibitor for his hypertension and had a tickle-like cough at one point in time but then it went away  Review of Systems Negative for chest pain shortness of breath nausea vomiting unusual rash see above  Past history family history social history reviewed in the electronic medical record.     Objective:   Physical Exam  BP 120/82  Pulse 100  Temp 99.6 F (37.6 C)  Wt 205 lb (92.987 kg)  SpO2 97%  Well-developed well-nourished in no acute distress quiet respirations no active severe coughing. Cough is dry on reproducing  HEENT: Normocephalic ;atraumatic , Eyes;  PERRL, EOMs  Full, lids and conjunctiva clear,,Ears: no deformities, canals nl, TM landmarks normal, Nose: no deformity or discharge  Mouth : OP clear without lesion or edema . Slight drainage tracts noted; face nontender.   Neck: Supple without adenopathy or masses or bruits Chest:  Clear to A&P without wheezes rales or rhonchi CV:  S1-S2 no gallops or murmurs peripheral perfusion is normal Skin: normal capillary refill ,turgor , color: No acute rashes ,petechiae or bruising      Assessment & Plan:   Cough  Most likely viral respiratory infection although postnasal drainage from allergy drip and possibly ACE inhibitor or aggravation. At this time I find no alarm symptoms with his history or exam we'll do close observation symptomatic treatment as needed and follow up with his primary care  physician if persistent or progressive. Alarm findings discussed expectant management. Avoid cough drops Use. sugar-free candy patient declined cough medicine.  Hypertension controlled on an ACEI

## 2011-09-21 ENCOUNTER — Telehealth: Payer: Self-pay | Admitting: Family Medicine

## 2011-09-21 NOTE — Telephone Encounter (Signed)
Patient called stating that he was referred to Dr. Danielle Dess and was prescribed relefen and now Dr. Danielle Dess states that his PCP should be the one to prescribe/refill this med. Please advise. Patient requests a response asap due to plans to go out of town tomorrow. Patient is aware that Dr. Tawanna Cooler is out of the office.

## 2011-09-25 NOTE — Telephone Encounter (Signed)
Please call find at the recommended dose and then call in prescription for patient

## 2011-09-25 NOTE — Telephone Encounter (Signed)
Spoke with patient and he will call back with dosage and instructions

## 2011-09-25 NOTE — Telephone Encounter (Signed)
Tried to call patient but unable to talk to patient

## 2011-09-26 MED ORDER — NABUMETONE 500 MG PO TABS: 500.0000 mg | ORAL_TABLET | Freq: Two times a day (BID) | ORAL | Status: DC

## 2011-09-26 NOTE — Telephone Encounter (Signed)
Addended by: Kern Reap B on: 09/26/2011 12:55 PM   Modules accepted: Orders

## 2011-11-13 ENCOUNTER — Ambulatory Visit (INDEPENDENT_AMBULATORY_CARE_PROVIDER_SITE_OTHER): Payer: BC Managed Care – PPO | Admitting: Family Medicine

## 2011-11-13 ENCOUNTER — Encounter: Payer: Self-pay | Admitting: Family Medicine

## 2011-11-13 VITALS — BP 130/90 | Temp 98.9°F | Wt 204.0 lb

## 2011-11-13 DIAGNOSIS — M549 Dorsalgia, unspecified: Secondary | ICD-10-CM

## 2011-11-13 MED ORDER — TRAMADOL HCL 50 MG PO TABS: ORAL_TABLET | ORAL | Status: DC

## 2011-11-13 NOTE — Patient Instructions (Addendum)
Tramadol    1/2 ior 1 qhs prn

## 2011-11-13 NOTE — Progress Notes (Signed)
  Subjective:    Patient ID: John Bowen, male    DOB: March 22, 1956, 56 y.o.   MRN: [id number]  HPI John Bowen is a 56 year old male who comes in today for a 3 week history of back pain  He states about 3 weeks ago he was weed eating and then the next day noticed some right posterior back pain. He points to the 10th and 11th rib in the posterior axillary line as a source of his discomfort. He states the pain is dull sometimes sharp. Is a 5 on a scale of 1-10 however he was able to play golf last Friday with minimal discomfort no shortness of breath no urinary tract symptoms   Review of Systems General and neurologic review of systems otherwise negative    Objective:   Physical Exam Well-developed well nourished male no acute distress examination of spine was normal there is tenderness between the 10th and 11th ribs intercostal space posterior axillary line lungs are clear       Assessment & Plan:  Chest wall pain plan reassured anti-inflammatory tramadol each bedtime when necessary

## 2011-11-14 ENCOUNTER — Ambulatory Visit: Payer: BC Managed Care – PPO | Admitting: Family Medicine

## 2012-01-04 ENCOUNTER — Other Ambulatory Visit: Payer: Self-pay | Admitting: *Deleted

## 2012-01-04 MED ORDER — LISINOPRIL 20 MG PO TABS: 20.0000 mg | ORAL_TABLET | Freq: Every day | ORAL | Status: DC

## 2012-04-26 ENCOUNTER — Ambulatory Visit (INDEPENDENT_AMBULATORY_CARE_PROVIDER_SITE_OTHER): Payer: BC Managed Care – PPO | Admitting: Family Medicine

## 2012-04-26 ENCOUNTER — Encounter: Payer: Self-pay | Admitting: Family Medicine

## 2012-04-26 VITALS — BP 130/80 | Temp 98.2°F | Wt 201.0 lb

## 2012-04-26 DIAGNOSIS — R059 Cough, unspecified: Secondary | ICD-10-CM

## 2012-04-26 DIAGNOSIS — Z23 Encounter for immunization: Secondary | ICD-10-CM

## 2012-04-26 DIAGNOSIS — R05 Cough: Secondary | ICD-10-CM

## 2012-04-26 MED ORDER — HYDROCODONE-HOMATROPINE 5-1.5 MG/5ML PO SYRP: 5.0000 mL | ORAL_SOLUTION | Freq: Three times a day (TID) | ORAL | Status: DC | PRN

## 2012-04-26 NOTE — Progress Notes (Signed)
  Subjective:    Patient ID: John Bowen, male    DOB: 1955-11-17, 56 y.o.   MRN: [id number]  HPI John Bowen is a 56 year old male married nonsmoker who comes in with a weeks history of sore throat head congestion postnasal drip and nonproductive cough. No fever chills   Review of Systems General and pulmonary of systems otherwise negative    Objective:   Physical Exam Well-developed well-nourished male in no acute distress HEENT negative neck was supple no adenopathy lungs are clear       Assessment & Plan:  Viral syndrome plan treat symptomatically

## 2012-04-26 NOTE — Patient Instructions (Addendum)
Drink lots of water  Tylenol when necessary  Hydromet 1/2-1 teaspoon 3 times daily for cough and cold  Use 1 shot of Afrin  up each nostril at bedtime then irrigate with warm salt water x5 nights

## 2012-05-02 ENCOUNTER — Other Ambulatory Visit (INDEPENDENT_AMBULATORY_CARE_PROVIDER_SITE_OTHER): Payer: BC Managed Care – PPO

## 2012-05-02 ENCOUNTER — Other Ambulatory Visit: Payer: BC Managed Care – PPO

## 2012-05-02 DIAGNOSIS — L659 Nonscarring hair loss, unspecified: Secondary | ICD-10-CM

## 2012-05-02 DIAGNOSIS — E291 Testicular hypofunction: Secondary | ICD-10-CM

## 2012-05-02 LAB — HEPATIC FUNCTION PANEL
ALT: 23 U/L (ref 0–53)
Bilirubin, Direct: 0.1 mg/dL (ref 0.0–0.3)
Total Bilirubin: 0.6 mg/dL (ref 0.3–1.2)

## 2012-05-02 LAB — CBC WITH DIFFERENTIAL/PLATELET
Eosinophils Relative: 2.4 % (ref 0.0–5.0)
Lymphocytes Relative: 31.2 % (ref 12.0–46.0)
MCV: 92.2 fl (ref 78.0–100.0)
Monocytes Absolute: 0.5 10*3/uL (ref 0.1–1.0)
Monocytes Relative: 10.1 % (ref 3.0–12.0)
Neutrophils Relative %: 56 % (ref 43.0–77.0)
Platelets: 279 10*3/uL (ref 150.0–400.0)
WBC: 5.4 10*3/uL (ref 4.5–10.5)

## 2012-05-02 LAB — BASIC METABOLIC PANEL
CO2: 26 mEq/L (ref 19–32)
Calcium: 8.8 mg/dL (ref 8.4–10.5)
Potassium: 4.3 mEq/L (ref 3.5–5.1)
Sodium: 142 mEq/L (ref 135–145)

## 2012-05-02 LAB — POCT URINALYSIS DIPSTICK
Ketones, UA: NEGATIVE
Leukocytes, UA: NEGATIVE
Protein, UA: NEGATIVE
Urobilinogen, UA: 0.2

## 2012-05-02 LAB — TSH: TSH: 1.24 u[IU]/mL (ref 0.35–5.50)

## 2012-05-02 LAB — LIPID PANEL
HDL: 43.9 mg/dL (ref >39.00)
Triglycerides: 69 mg/dL (ref 0.0–149.0)
VLDL: 13.8 mg/dL (ref 0.0–40.0)

## 2012-05-13 ENCOUNTER — Encounter: Payer: BC Managed Care – PPO | Admitting: Family Medicine

## 2012-06-06 ENCOUNTER — Encounter: Payer: Self-pay | Admitting: Internal Medicine

## 2012-06-26 ENCOUNTER — Other Ambulatory Visit: Payer: Self-pay | Admitting: Family Medicine

## 2012-07-16 ENCOUNTER — Other Ambulatory Visit: Payer: Self-pay | Admitting: *Deleted

## 2012-07-16 MED ORDER — LISINOPRIL 20 MG PO TABS: 20.0000 mg | ORAL_TABLET | Freq: Every day | ORAL | Status: DC

## 2012-08-29 ENCOUNTER — Other Ambulatory Visit (HOSPITAL_COMMUNITY): Payer: BC Managed Care – PPO

## 2012-09-04 ENCOUNTER — Inpatient Hospital Stay: Admit: 2012-09-04 | Payer: Self-pay | Admitting: Orthopedic Surgery

## 2012-09-04 SURGERY — ARTHROPLASTY, KNEE, TOTAL
Anesthesia: General | Laterality: Left

## 2012-11-28 ENCOUNTER — Other Ambulatory Visit: Payer: Self-pay | Admitting: Family Medicine

## 2013-01-22 ENCOUNTER — Encounter: Payer: Self-pay | Admitting: Internal Medicine

## 2013-03-20 ENCOUNTER — Other Ambulatory Visit: Payer: Self-pay | Admitting: *Deleted

## 2013-03-20 MED ORDER — CITALOPRAM HYDROBROMIDE 20 MG PO TABS: ORAL_TABLET | ORAL | Status: DC

## 2013-05-19 ENCOUNTER — Other Ambulatory Visit: Payer: Self-pay | Admitting: Family Medicine

## 2013-06-23 ENCOUNTER — Other Ambulatory Visit: Payer: Self-pay | Admitting: Family Medicine

## 2013-06-30 ENCOUNTER — Telehealth: Payer: Self-pay | Admitting: Family Medicine

## 2013-06-30 MED ORDER — CITALOPRAM HYDROBROMIDE 20 MG PO TABS: ORAL_TABLET | ORAL | Status: DC

## 2013-06-30 MED ORDER — LISINOPRIL 20 MG PO TABS: 20.0000 mg | ORAL_TABLET | Freq: Every day | ORAL | Status: DC

## 2013-06-30 NOTE — Telephone Encounter (Signed)
Rx sent to pharmacy   

## 2013-06-30 NOTE — Telephone Encounter (Signed)
Pt has appt sch for cpx on 08-21-2013. Pt needs refills on lisinopril and citalopram call into [facility] [address]

## 2013-07-01 ENCOUNTER — Ambulatory Visit: Payer: BC Managed Care – PPO | Admitting: Family Medicine

## 2013-08-14 ENCOUNTER — Other Ambulatory Visit (INDEPENDENT_AMBULATORY_CARE_PROVIDER_SITE_OTHER): Payer: BC Managed Care – PPO

## 2013-08-14 DIAGNOSIS — Z0000 Encounter for general adult medical examination without abnormal findings: Secondary | ICD-10-CM

## 2013-08-14 LAB — CBC WITH DIFFERENTIAL/PLATELET
BASOS ABS: 0 10*3/uL (ref 0.0–0.1)
Basophils Relative: 0.3 % (ref 0.0–3.0)
EOS ABS: 0.1 10*3/uL (ref 0.0–0.7)
Eosinophils Relative: 2.2 % (ref 0.0–5.0)
HCT: 43.4 % (ref 39.0–52.0)
Hemoglobin: 14.7 g/dL (ref 13.0–17.0)
LYMPHS PCT: 33.1 % (ref 12.0–46.0)
Lymphs Abs: 1.7 10*3/uL (ref 0.7–4.0)
MCHC: 33.8 g/dL (ref 30.0–36.0)
MCV: 90 fl (ref 78.0–100.0)
Monocytes Absolute: 0.5 10*3/uL (ref 0.1–1.0)
Monocytes Relative: 9.1 % (ref 3.0–12.0)
NEUTROS PCT: 55.3 % (ref 43.0–77.0)
Neutro Abs: 2.9 10*3/uL (ref 1.4–7.7)
PLATELETS: 292 10*3/uL (ref 150.0–400.0)
RBC: 4.82 Mil/uL (ref 4.22–5.81)
RDW: 13.5 % (ref 11.5–14.6)
WBC: 5.2 10*3/uL (ref 4.5–10.5)

## 2013-08-14 LAB — TSH: TSH: 1.3 u[IU]/mL (ref 0.35–5.50)

## 2013-08-14 LAB — LIPID PANEL
Cholesterol: 197 mg/dL (ref 0–200)
HDL: 45.9 mg/dL (ref >39.00)
LDL CALC: 131 mg/dL — AB (ref 0–99)
TRIGLYCERIDES: 102 mg/dL (ref 0.0–149.0)
Total CHOL/HDL Ratio: 4
VLDL: 20.4 mg/dL (ref 0.0–40.0)

## 2013-08-14 LAB — POCT URINALYSIS DIPSTICK
Bilirubin, UA: NEGATIVE
GLUCOSE UA: NEGATIVE
Ketones, UA: NEGATIVE
Leukocytes, UA: NEGATIVE
Nitrite, UA: NEGATIVE
PROTEIN UA: NEGATIVE
RBC UA: NEGATIVE
SPEC GRAV UA: 1.025
UROBILINOGEN UA: 0.2
pH, UA: 5.5

## 2013-08-14 LAB — HEPATIC FUNCTION PANEL
ALT: 32 U/L (ref 0–53)
AST: 22 U/L (ref 0–37)
Albumin: 4.4 g/dL (ref 3.5–5.2)
Alkaline Phosphatase: 70 U/L (ref 39–117)
BILIRUBIN DIRECT: 0.1 mg/dL (ref 0.0–0.3)
Total Bilirubin: 0.6 mg/dL (ref 0.3–1.2)
Total Protein: 7 g/dL (ref 6.0–8.3)

## 2013-08-14 LAB — BASIC METABOLIC PANEL
BUN: 21 mg/dL (ref 6–23)
CHLORIDE: 108 meq/L (ref 96–112)
CO2: 26 mEq/L (ref 19–32)
Calcium: 8.7 mg/dL (ref 8.4–10.5)
Creatinine, Ser: 0.9 mg/dL (ref 0.4–1.5)
GFR: 90.29 mL/min (ref >60.00)
Glucose, Bld: 97 mg/dL (ref 70–99)
POTASSIUM: 4.4 meq/L (ref 3.5–5.1)
Sodium: 142 mEq/L (ref 135–145)

## 2013-08-14 LAB — PSA: PSA: 0.86 ng/mL (ref 0.10–4.00)

## 2013-08-21 ENCOUNTER — Ambulatory Visit (INDEPENDENT_AMBULATORY_CARE_PROVIDER_SITE_OTHER): Payer: BC Managed Care – PPO | Admitting: Family Medicine

## 2013-08-21 ENCOUNTER — Encounter: Payer: Self-pay | Admitting: Family Medicine

## 2013-08-21 VITALS — BP 120/80 | Temp 98.5°F | Ht 69.5 in | Wt 210.0 lb

## 2013-08-21 DIAGNOSIS — L719 Rosacea, unspecified: Secondary | ICD-10-CM | POA: Insufficient documentation

## 2013-08-21 DIAGNOSIS — IMO0002 Reserved for concepts with insufficient information to code with codable children: Secondary | ICD-10-CM

## 2013-08-21 DIAGNOSIS — I10 Essential (primary) hypertension: Secondary | ICD-10-CM

## 2013-08-21 DIAGNOSIS — Z0000 Encounter for general adult medical examination without abnormal findings: Secondary | ICD-10-CM

## 2013-08-21 MED ORDER — LISINOPRIL 20 MG PO TABS
20.0000 mg | ORAL_TABLET | Freq: Every day | ORAL | Status: DC
Start: 2013-08-21 — End: 2014-09-30

## 2013-08-21 MED ORDER — CITALOPRAM HYDROBROMIDE 20 MG PO TABS: ORAL_TABLET | ORAL | Status: DC

## 2013-08-21 NOTE — Patient Instructions (Signed)
Continue your current medications  Remember to walk 30 minutes daily  Followup in 1 year sooner if any problems

## 2013-08-21 NOTE — Progress Notes (Signed)
Pre visit review using our clinic review tool, if applicable. No additional management support is needed unless otherwise documented below in the visit note. 

## 2013-08-21 NOTE — Progress Notes (Signed)
   Subjective:    Patient ID: John Bowen, male    DOB: 1955-07-04, 58 y.o.   MRN: [id number]  HPI John Bowen is a 58 year old married male nonsmoker who comes in today for general physical examination. He has a history of some sleep dysfunction for which he takes Celexa 20 mg each bedtime  He takes lisinopril 20 mg daily for hypertension. BP 120/80  He's now on doxycycline 100 mg twice a day from his dermatologist for adult acne  He gets routine eye care, dental care, colonoscopy in his 81s normal, vaccinations up-to-date   Review of Systems  Constitutional: Negative.   HENT: Negative.   Eyes: Negative.   Respiratory: Negative.   Cardiovascular: Negative.   Gastrointestinal: Negative.   Endocrine: Negative.   Genitourinary: Negative.   Musculoskeletal: Negative.   Skin: Negative.   Allergic/Immunologic: Negative.   Neurological: Negative.   Hematological: Negative.   Psychiatric/Behavioral: Negative.        Objective:   Physical Exam  Nursing note and vitals reviewed. Constitutional: He is oriented to person, place, and time. He appears well-developed and well-nourished.  HENT:  Head: Normocephalic and atraumatic.  Right Ear: External ear normal.  Left Ear: External ear normal.  Nose: Nose normal.  Mouth/Throat: Oropharynx is clear and moist.  Eyes: Conjunctivae and EOM are normal. Pupils are equal, round, and reactive to light.  Neck: Normal range of motion. Neck supple. No JVD present. No tracheal deviation present. No thyromegaly present.  Cardiovascular: Normal rate, regular rhythm, normal heart sounds and intact distal pulses.  Exam reveals no gallop and no friction rub.   No murmur heard. Pulmonary/Chest: Effort normal and breath sounds normal. No stridor. No respiratory distress. He has no wheezes. He has no rales. He exhibits no tenderness.  Abdominal: Soft. Bowel sounds are normal. He exhibits no distension and no mass. There is no tenderness. There is no rebound and  no guarding.  Genitourinary: Rectum normal, prostate normal and penis normal. Guaiac negative stool. No penile tenderness.  Musculoskeletal: Normal range of motion. He exhibits no edema and no tenderness.  Lymphadenopathy:    He has no cervical adenopathy.  Neurological: He is alert and oriented to person, place, and time. He has normal reflexes. No cranial nerve deficit. He exhibits normal muscle tone.  Skin: Skin is warm and dry. No rash noted. No erythema. No pallor.  Total body skin exam normal there is a scar on his back we had a lesion removed he's got some mild rosacea.  Psychiatric: He has a normal mood and affect. His behavior is normal. Judgment and thought content normal.          Assessment & Plan:  Healthy male  Hypertension continue lisinopril  Sleep dysfunction continue Celexa  Rosacea continue doxycycline

## 2013-08-22 ENCOUNTER — Telehealth: Payer: Self-pay | Admitting: Family Medicine

## 2013-08-22 NOTE — Telephone Encounter (Signed)
Relevant patient education mailed to patient.  

## 2014-04-20 ENCOUNTER — Encounter: Payer: Self-pay | Admitting: Family Medicine

## 2014-04-20 ENCOUNTER — Ambulatory Visit (INDEPENDENT_AMBULATORY_CARE_PROVIDER_SITE_OTHER): Payer: BC Managed Care – PPO | Admitting: Family Medicine

## 2014-04-20 VITALS — BP 130/90 | Temp 98.2°F | Wt 205.0 lb

## 2014-04-20 DIAGNOSIS — H9201 Otalgia, right ear: Secondary | ICD-10-CM

## 2014-04-20 DIAGNOSIS — M1732 Unilateral post-traumatic osteoarthritis, left knee: Secondary | ICD-10-CM

## 2014-04-20 DIAGNOSIS — M1712 Unilateral primary osteoarthritis, left knee: Secondary | ICD-10-CM | POA: Insufficient documentation

## 2014-04-20 NOTE — Progress Notes (Signed)
   Subjective:    Patient ID: John Bowen, male    DOB: 04/20/1956, 58 y.o.   MRN: [id number]  HPI John Bowen  is a 59 year old married male nonsmoker who comes in today for evaluation 2 problems  He's had pain in his right ear now for about 2 weeks. No history of fever sore throat etc.  He's also had symmetrical joint pain without erythema for couple months. His mother had osteoarthritis  His other joints are fine except he needs both knee knees replaced.........Marland Kitchen played football at [employer]   Review of Systems    review of systems otherwise negative Objective:   Physical Exam  Well-developed well-nourished male no acute distress vital signs stable is afebrile HEENT were negative tenderness in the right TMJ  Inspection of the hands is normal no palpable tenderness no erythema no warmth      Assessment & Plan:  TMJ right............ treat symptomatically  Osteoarthritis... Hands......Marland Kitchen Motrin 600 twice a day.

## 2014-04-20 NOTE — Patient Instructions (Signed)
Motrin 600 mg twice daily with food,,,,, for your ear and your hands  Soft diet  Mouthguard at bedtime  Consult with your dentist ASAP

## 2014-04-20 NOTE — Progress Notes (Signed)
Pre visit review using our clinic review tool, if applicable. No additional management support is needed unless otherwise documented below in the visit note. 

## 2014-05-01 ENCOUNTER — Other Ambulatory Visit: Payer: Self-pay | Admitting: Family Medicine

## 2014-07-09 ENCOUNTER — Other Ambulatory Visit: Payer: Self-pay | Admitting: Family Medicine

## 2014-09-30 ENCOUNTER — Other Ambulatory Visit: Payer: Self-pay | Admitting: Family Medicine

## 2014-12-28 ENCOUNTER — Encounter: Payer: Self-pay | Admitting: Family Medicine

## 2014-12-28 ENCOUNTER — Ambulatory Visit (INDEPENDENT_AMBULATORY_CARE_PROVIDER_SITE_OTHER): Payer: BLUE CROSS/BLUE SHIELD | Admitting: Family Medicine

## 2014-12-28 VITALS — BP 139/79 | HR 66 | Temp 99.1°F | Ht 69.5 in | Wt 203.0 lb

## 2014-12-28 DIAGNOSIS — J039 Acute tonsillitis, unspecified: Secondary | ICD-10-CM | POA: Diagnosis not present

## 2014-12-28 MED ORDER — HYDROCODONE-HOMATROPINE 5-1.5 MG/5ML PO SYRP: 5.0000 mL | ORAL_SOLUTION | ORAL | Status: DC | PRN

## 2014-12-28 MED ORDER — AMOXICILLIN-POT CLAVULANATE 875-125 MG PO TABS: 1.0000 | ORAL_TABLET | Freq: Two times a day (BID) | ORAL | Status: DC

## 2014-12-28 MED ORDER — METHYLPREDNISOLONE ACETATE 80 MG/ML IJ SUSP
120.0000 mg | Freq: Once | INTRAMUSCULAR | Status: AC
  Administered 2014-12-28: 120 mg via INTRAMUSCULAR

## 2014-12-28 NOTE — Addendum Note (Signed)
Addended by: Aggie Hacker A on: 12/28/2014 11:57 AM   Modules accepted: Orders

## 2014-12-28 NOTE — Progress Notes (Signed)
Pre visit review using our clinic review tool, if applicable. No additional management support is needed unless otherwise documented below in the visit note. 

## 2014-12-28 NOTE — Progress Notes (Signed)
   Subjective:    Patient ID: John Bowen, male    DOB: Jul 06, 1955, 59 y.o.   MRN: [id number]  HPI Here for 5 days of a very ST, particularly on the right side. He has tender swollen nodes in the right neck area. He has a cough that is mostly dry but can produce some clear sputum. He has some pain in the right ear and a low grade fever. Drinking fluids.    Review of Systems  Constitutional: Positive for fever.  HENT: Positive for ear pain, sore throat, trouble swallowing and voice change. Negative for congestion, postnasal drip, rhinorrhea and sinus pressure.   Eyes: Negative.   Respiratory: Positive for cough.   Gastrointestinal: Negative.        Objective:   Physical Exam  Constitutional: He appears well-developed and well-nourished.  HENT:  Right Ear: External ear normal.  Left Ear: External ear normal.  Nose: Nose normal.  Mouth/Throat: No oropharyngeal exudate.  The right tonsil and posterior OP are red without exudate   Eyes: Conjunctivae are normal.  Neck: No thyromegaly present.  Shotty tender AC nodes on the right side   Pulmonary/Chest: Effort normal and breath sounds normal.  Skin: No rash noted.          Assessment & Plan:  Tonsillitis. Treat with Augmentin. Given a steroid shot for comfort.

## 2015-01-12 ENCOUNTER — Other Ambulatory Visit: Payer: Self-pay | Admitting: Family Medicine

## 2015-04-28 ENCOUNTER — Other Ambulatory Visit: Payer: Self-pay | Admitting: Family Medicine

## 2015-05-18 ENCOUNTER — Other Ambulatory Visit: Payer: Self-pay | Admitting: Family Medicine

## 2015-06-28 ENCOUNTER — Ambulatory Visit (INDEPENDENT_AMBULATORY_CARE_PROVIDER_SITE_OTHER): Payer: BLUE CROSS/BLUE SHIELD | Admitting: Adult Health

## 2015-06-28 ENCOUNTER — Encounter: Payer: Self-pay | Admitting: Adult Health

## 2015-06-28 VITALS — BP 120/80 | Temp 98.8°F | Ht 69.5 in | Wt 205.9 lb

## 2015-06-28 DIAGNOSIS — L259 Unspecified contact dermatitis, unspecified cause: Secondary | ICD-10-CM | POA: Diagnosis not present

## 2015-06-28 MED ORDER — PREDNISONE 10 MG PO TABS: 10.0000 mg | ORAL_TABLET | Freq: Every day | ORAL | Status: DC

## 2015-06-28 NOTE — Addendum Note (Signed)
Addended by: Apolinar Junes on: 06/28/2015 10:37 AM   Modules accepted: Level of Service

## 2015-06-28 NOTE — Progress Notes (Signed)
   Subjective:    Patient ID: John Bowen, male    DOB: 11/02/1955, 60 y.o.   MRN: [id number]  HPI 60 year old male who presents to the office today for possible exposure to poison oak. He was helping a friend with yard work about 7 days ago and came in contact with poison oak. He endorses having a rash with vesicles on his right and left wrist as well as his left ankle.   The rash on his right wrist has become bigger but the swelling has diminished. He complains of severe itching.   Has been using calamine lotion and hydrocortisone cream with little relief.    Review of Systems  Constitutional: Negative.   Skin: Positive for color change, rash and wound.  All other systems reviewed and are negative.  Past Medical History  Diagnosis Date  . Dysfunction of sleep stage or arousal   . DJD (degenerative joint disease)     knees  . Meningeal disorder   . Hypertension     Social History   Social History  . Marital Status: Married    Spouse Name: N/A  . Number of Children: N/A  . Years of Education: N/A   Occupational History  . Not on file.   Social History Main Topics  . Smoking status: Never Smoker   . Smokeless tobacco: Never Used  . Alcohol Use: 1.8 oz/week    3 Cans of beer per week  . Drug Use: No  . Sexual Activity: Not on file   Other Topics Concern  . Not on file   Social History Narrative    No past surgical history on file.  Family History  Problem Relation Age of Onset  . Hypertension Other     No Known Allergies  Current Outpatient Prescriptions on File Prior to Visit  Medication Sig Dispense Refill  . citalopram (CELEXA) 20 MG tablet TAKE ONE AND ONE-HALF TABLETS BY MOUTH ONCE DAILY 135 tablet 0  . lisinopril (PRINIVIL,ZESTRIL) 20 MG tablet TAKE ONE TABLET BY MOUTH ONCE DAILY 90 tablet 1   No current facility-administered medications on file prior to visit.    BP 120/80 mmHg  Temp(Src) 98.8 F (37.1 C) (Oral)  Ht 5' 9.5" (1.765 m)  Wt 205  lb 14.4 oz (93.396 kg)  BMI 29.98 kg/m2       Objective:   Physical Exam  Constitutional: He is oriented to person, place, and time. He appears well-developed and well-nourished. No distress.  Musculoskeletal: Normal range of motion.  Neurological: He is alert and oriented to person, place, and time.  Skin: Skin is warm and dry. Rash noted. He is not diaphoretic.  Large vesicular rash with clear gold colored drainage on right wrist.   Small, quarter sized red vesicular rash on left wrist and left ankle.   No signs of infection.   Psychiatric: He has a normal mood and affect. His behavior is normal. Judgment and thought content normal.  Nursing note and vitals reviewed.      Assessment & Plan:  1. Contact dermatitis - predniSONE (DELTASONE) 10 MG tablet; Take 1 tablet (10 mg total) by mouth daily with breakfast.  Dispense: 21 tablet; Refill: 0 - Keep wound clean and dry  - Cover with gauze - Benadryl for itching.  - Can continue with calamine lotion and hydrocortisone - Follow up with any signs of infection.

## 2015-06-28 NOTE — Progress Notes (Signed)
Pre visit review using our clinic review tool, if applicable. No additional management support is needed unless otherwise documented below in the visit note. 

## 2015-06-28 NOTE — Patient Instructions (Addendum)
It was great meeting you today  I have sent in a prescription for prednisone. Please take as directed  Day 1 40 mg Day 2 40 mg Day 3 40 mg Day 4 20 mg Day 5 20 mg Day 6 20 mg Day 7 10 mg Day 8 10 mg Day 9 10 mg  Keep wound clean and dry and let me know if there are any signs of infection. You can continue to apply calamine lotion and/or the hydrocortisone cream.     Poison Ivy Poison ivy is a inflammation of the skin (contact dermatitis) caused by touching the allergens on the leaves of the ivy plant following previous exposure to the plant. The rash usually appears 48 hours after exposure. The rash is usually bumps (papules) or blisters (vesicles) in a linear pattern. Depending on your own sensitivity, the rash may simply cause redness and itching, or it may also progress to blisters which may break open. These must be well cared for to prevent secondary bacterial (germ) infection, followed by scarring. Keep any open areas dry, clean, dressed, and covered with an antibacterial ointment if needed. The eyes may also get puffy. The puffiness is worst in the morning and gets better as the day progresses. This dermatitis usually heals without scarring, within 2 to 3 weeks without treatment. HOME CARE INSTRUCTIONS  Thoroughly wash with soap and water as soon as you have been exposed to poison ivy. You have about one half hour to remove the plant resin before it will cause the rash. This washing will destroy the oil or antigen on the skin that is causing, or will cause, the rash. Be sure to wash under your fingernails as any plant resin there will continue to spread the rash. Do not rub skin vigorously when washing affected area. Poison ivy cannot spread if no oil from the plant remains on your body. A rash that has progressed to weeping sores will not spread the rash unless you have not washed thoroughly. It is also important to wash any clothes you have been wearing as these may carry active  allergens. The rash will return if you wear the unwashed clothing, even several days later. Avoidance of the plant in the future is the best measure. Poison ivy plant can be recognized by the number of leaves. Generally, poison ivy has three leaves with flowering branches on a single stem. Diphenhydramine may be purchased over the counter and used as needed for itching. Do not drive with this medication if it makes you drowsy.Ask your caregiver about medication for children. SEEK MEDICAL CARE IF:  Open sores develop.  Redness spreads beyond area of rash.  You notice purulent (pus-like) discharge.  You have increased pain.  Other signs of infection develop (such as fever).   This information is not intended to replace advice given to you by your health care provider. Make sure you discuss any questions you have with your health care provider.   Document Released: 05/12/2000 Document Revised: 08/07/2011 Document Reviewed: 10/21/2014 Elsevier Interactive Patient Education [employer].

## 2015-08-05 ENCOUNTER — Other Ambulatory Visit: Payer: Self-pay | Admitting: Family Medicine

## 2015-10-28 ENCOUNTER — Ambulatory Visit (INDEPENDENT_AMBULATORY_CARE_PROVIDER_SITE_OTHER): Payer: BLUE CROSS/BLUE SHIELD | Admitting: Family Medicine

## 2015-10-28 ENCOUNTER — Encounter: Payer: Self-pay | Admitting: Family Medicine

## 2015-10-28 VITALS — BP 120/80 | HR 66 | Temp 98.7°F | Ht 69.5 in | Wt 201.7 lb

## 2015-10-28 DIAGNOSIS — J039 Acute tonsillitis, unspecified: Secondary | ICD-10-CM | POA: Diagnosis not present

## 2015-10-28 MED ORDER — AMOXICILLIN-POT CLAVULANATE 875-125 MG PO TABS: 1.0000 | ORAL_TABLET | Freq: Two times a day (BID) | ORAL | Status: DC

## 2015-10-28 MED ORDER — PREDNISONE 10 MG PO TABS
ORAL_TABLET | ORAL | Status: DC
Start: 2015-10-28 — End: 2015-12-06

## 2015-10-28 NOTE — Patient Instructions (Signed)
Please take medication as directed with food and follow up if symptoms do not improve in 3-4 days, worsen, or you develop new symptoms or fever .101.  If these symptoms do not resolve or reoccur with completion of treatment, a referral to a specialist may be considered.  Pharyngitis Pharyngitis is redness, pain, and swelling (inflammation) of your pharynx.  CAUSES  Pharyngitis is usually caused by infection. Most of the time, these infections are from viruses (viral) and are part of a cold. However, sometimes pharyngitis is caused by bacteria (bacterial). Pharyngitis can also be caused by allergies. Viral pharyngitis may be spread from person to person by coughing, sneezing, and personal items or utensils (cups, forks, spoons, toothbrushes). Bacterial pharyngitis may be spread from person to person by more intimate contact, such as kissing.  SIGNS AND SYMPTOMS  Symptoms of pharyngitis include:   Sore throat.   Tiredness (fatigue).   Low-grade fever.   Headache.  Joint pain and muscle aches.  Skin rashes.  Swollen lymph nodes.  Plaque-like film on throat or tonsils (often seen with bacterial pharyngitis). DIAGNOSIS  Your health care provider will ask you questions about your illness and your symptoms. Your medical history, along with a physical exam, is often all that is needed to diagnose pharyngitis. Sometimes, a rapid strep test is done. Other lab tests may also be done, depending on the suspected cause.  TREATMENT  Viral pharyngitis will usually get better in 3-4 days without the use of medicine. Bacterial pharyngitis is treated with medicines that kill germs (antibiotics).  HOME CARE INSTRUCTIONS   Drink enough water and fluids to keep your urine clear or pale yellow.   Only take over-the-counter or prescription medicines as directed by your health care provider:   If you are prescribed antibiotics, make sure you finish them even if you start to feel better.   Do not  take aspirin.   Get lots of rest.   Gargle with 8 oz of salt water ( tsp of salt per 1 qt of water) as often as every 1-2 hours to soothe your throat.   Throat lozenges (if you are not at risk for choking) or sprays may be used to soothe your throat. SEEK MEDICAL CARE IF:   You have large, tender lumps in your neck.  You have a rash.  You cough up green, yellow-brown, or bloody spit. SEEK IMMEDIATE MEDICAL CARE IF:   Your neck becomes stiff.  You drool or are unable to swallow liquids.  You vomit or are unable to keep medicines or liquids down.  You have severe pain that does not go away with the use of recommended medicines.  You have trouble breathing (not caused by a stuffy nose). MAKE SURE YOU:   Understand these instructions.  Will watch your condition.  Will get help right away if you are not doing well or get worse.   This information is not intended to replace advice given to you by your health care provider. Make sure you discuss any questions you have with your health care provider.   Document Released: 05/15/2005 Document Revised: 03/05/2013 Document Reviewed: 01/20/2013 Elsevier Interactive Patient Education [employer].

## 2015-10-28 NOTE — Progress Notes (Addendum)
Subjective:    Patient ID: Jahri Epperson, male    DOB: [birth year], 60 y.o.   MRN: [id number]  HPI  Mr. Delma Post is a 60 year old male who presents today with a sore throat which has been present for 2-3 weeks.  Associated symptoms of rare cough that is nonproductive, rhinitis, post nasal drip, and ear pain are present today. He also reports a history of low grade fever. He denies symptoms of fever today, chills, sweats, N/V/D,  tooth pain, sinus pressure/pain, and congestion today. He also denies symptoms of GERD, recent sick contact exposure, recent antibiotic use, and reports taking medication as prescribed. Treatments include chloraseptic spray which has provided limited benefit.  He was seen in 12/28/2014 for similar symptoms that were treated with Augmentin and steroid therapy which provided benefit and resolution of symptoms.   He reports a possible trigger for his sore throat of using an electronic cigarette once prior to these symptoms. He states that he inhaled one time and noted burning and did not use the electronic cigarette again.    Review of Systems  Constitutional: Negative for fever, chills and fatigue.  HENT: Positive for ear pain, postnasal drip, rhinorrhea and sore throat. Negative for congestion, sinus pressure, sneezing, tinnitus, trouble swallowing and voice change.   Eyes:       Watery eyes are noted.  Respiratory: Negative for cough, shortness of breath and wheezing.   Cardiovascular: Negative for chest pain and palpitations.  Gastrointestinal: Negative for nausea, vomiting, abdominal pain, diarrhea and constipation.  Genitourinary: Negative for dysuria, urgency, frequency and flank pain.  Musculoskeletal: Negative for myalgias, joint swelling and arthralgias.  Skin: Negative for rash.  Neurological: Negative for dizziness, light-headedness and headaches.  Psychiatric/Behavioral:       Denies depressed or anxious mood. Anxiety is well controlled with citalopram per  patient report.   Past Medical History  Diagnosis Date  . Dysfunction of sleep stage or arousal   . DJD (degenerative joint disease)     knees  . Meningeal disorder   . Hypertension      Social History   Social History  . Marital Status: Married    Spouse Name: N/A  . Number of Children: N/A  . Years of Education: N/A   Occupational History  . Not on file.   Social History Main Topics  . Smoking status: Never Smoker   . Smokeless tobacco: Never Used  . Alcohol Use: 1.8 oz/week    3 Cans of beer per week  . Drug Use: No  . Sexual Activity: Not on file   Other Topics Concern  . Not on file   Social History Narrative    No past surgical history on file.  Family History  Problem Relation Age of Onset  . Hypertension Other     No Known Allergies  Current Outpatient Prescriptions on File Prior to Visit  Medication Sig Dispense Refill  . citalopram (CELEXA) 20 MG tablet TAKE ONE AND ONE-HALF TABLETS BY MOUTH ONCE DAILY. NEED OFFICE VISIT FOR MORE REFILLS 135 tablet 0  . lisinopril (PRINIVIL,ZESTRIL) 20 MG tablet TAKE ONE TABLET BY MOUTH ONCE DAILY 90 tablet 1   No current facility-administered medications on file prior to visit.    BP 120/80 mmHg  Pulse 66  Temp(Src) 98.7 F (37.1 C) (Oral)  Ht 5' 9.5" (1.765 m)  Wt 201 lb 11.2 oz (91.491 kg)  BMI 29.37 kg/m2  SpO2 98%       Objective:  Physical Exam  Constitutional: He is oriented to person, place, and time. He appears well-developed and well-nourished.  HENT:  Right Ear: Tympanic membrane normal.  Left Ear: Tympanic membrane normal.  Nose: Rhinorrhea present. Right sinus exhibits no maxillary sinus tenderness and no frontal sinus tenderness. Left sinus exhibits no maxillary sinus tenderness and no frontal sinus tenderness.  Mouth/Throat: Mucous membranes are normal. Posterior oropharyngeal erythema present.  Right tonsil exhibits erythema and exudate.  Eyes: Pupils are equal, round, and reactive  to light. No scleral icterus.  Neck: Neck supple.  Cardiovascular: Normal rate, regular rhythm and normal heart sounds.   Pulmonary/Chest: Effort normal and breath sounds normal. He has no wheezes. He has no rales.  Abdominal: Soft. Bowel sounds are normal.  Lymphadenopathy:    He has cervical adenopathy.  Neurological: He is alert and oriented to person, place, and time. Coordination normal.  Skin: Skin is warm and dry. No rash noted.  Psychiatric: He has a normal mood and affect. His behavior is normal. Judgment and thought content normal.       Assessment & Plan:  1. Acute tonsillitis, unspecified etiology Exam and history support treatment for strep coverage with exudate on tonsils, anterior cervical lymphadenopathy, and report of symptoms not improving over the past 2 to 3 weeks with history of fever at home. Advised patient that if symptoms do not improve in 3-4 days, worsen, or new symptoms occur, follow up is recommended for further evaluation and treatment. Also discussed that if these symptoms reoccur and do not improve, an ENT referral can be considered.  Advised patient to follow up with PCP or another provider if PCP is not available if the above occurs and symptoms continue. Patient voiced understanding and agreed with plan.  - amoxicillin-clavulanate (AUGMENTIN) 875-125 MG tablet; Take 1 tablet by mouth 2 (two) times daily.  Dispense: 20 tablet; Refill: 0 - predniSONE (DELTASONE) 10 MG tablet; Take 4 tablets by mouth once daily for 2 days, 3 tabs once daily for 2 days, 2 tabs once daily for 2 days, and 1 tab daily for 2 days.  Dispense: 20 tablet; Refill: 0   Advised patient to schedule an appointment for routine health examination.  Delano Metz, FNP-C

## 2015-11-15 ENCOUNTER — Other Ambulatory Visit: Payer: Self-pay | Admitting: Family Medicine

## 2015-11-15 NOTE — Telephone Encounter (Signed)
Okay to refill medications?

## 2015-11-16 NOTE — Telephone Encounter (Signed)
Okay to refill. He is due for a cpe and will need to set one up for future refills. Thanks, Almyra Free

## 2015-11-17 ENCOUNTER — Other Ambulatory Visit: Payer: Self-pay

## 2015-11-17 MED ORDER — CITALOPRAM HYDROBROMIDE 20 MG PO TABS: ORAL_TABLET | ORAL | Status: DC

## 2015-11-17 MED ORDER — LISINOPRIL 20 MG PO TABS: 20.0000 mg | ORAL_TABLET | Freq: Every day | ORAL | Status: DC

## 2015-11-17 NOTE — Addendum Note (Signed)
Addended by: Milta Deiters on: 11/17/2015 09:52 AM   Modules accepted: Orders

## 2015-12-02 ENCOUNTER — Telehealth: Payer: Self-pay | Admitting: Family Medicine

## 2015-12-02 NOTE — Telephone Encounter (Signed)
Pt wanted to let you Gregary Signs) know his throat cleared up for a week and now it is back with a vengeance.  Pt would like to have a call back.

## 2015-12-02 NOTE — Telephone Encounter (Signed)
I left a message at the pts cell number to return my call. 

## 2015-12-02 NOTE — Telephone Encounter (Signed)
Pt aware he will need an office visit for further evaluation.  Pt has made appointment for Monday 8:30 am with Almyra Free.

## 2015-12-02 NOTE — Telephone Encounter (Signed)
Since he was evaluated greater than one month ago he will need an OV for further evaluation. Advise that he follow up for evaluation and treatment.

## 2015-12-06 ENCOUNTER — Ambulatory Visit (INDEPENDENT_AMBULATORY_CARE_PROVIDER_SITE_OTHER): Payer: BLUE CROSS/BLUE SHIELD | Admitting: Family Medicine

## 2015-12-06 ENCOUNTER — Encounter: Payer: Self-pay | Admitting: Family Medicine

## 2015-12-06 VITALS — BP 130/90 | HR 64 | Temp 98.3°F | Ht 69.5 in | Wt 199.0 lb

## 2015-12-06 DIAGNOSIS — J029 Acute pharyngitis, unspecified: Secondary | ICD-10-CM

## 2015-12-06 NOTE — Progress Notes (Signed)
Subjective:    Patient ID: Arkie Hagge, male    DOB: October 27, 1955, 60 y.o.   MRN: [id number]  HPI  Mr. Delma Post is a 60 year old male who presents today for a sore throat that has been present for one week. He was seen for acute tonsillitis on 10/28/2015, treated with Augmentin for 10 days and prednisone which provided excellent benefit. He reports that his symptom of sore throat has returned, however with at home treatment of salt water gargles he has noticed that this symptom is improving. He denies rhinitis, post nasal drip, sinus pressure/pain, ear pain, fever, chills, sweats, N/V/D.  He has a nonproductive cough that he believes may be associated with his lisinopril therapy that he will address with his PCP if it continues.  He also reports a possible trigger for recurrent sore throat discomfort of using an electronic cigarette once that he felt "burned his throat". No recent sick contact exposure. [place] a [place] one week ago.  Review of Systems  Constitutional: Negative for fever and chills.  HENT: Positive for sore throat. Negative for congestion, ear pain, postnasal drip, rhinorrhea, sinus pressure and sneezing.   Respiratory: Positive for cough. Negative for shortness of breath and wheezing.   Cardiovascular: Negative for chest pain and palpitations.  Gastrointestinal: Negative for nausea, vomiting, abdominal pain, diarrhea and constipation.  Skin: Negative for rash.   Past Medical History  Diagnosis Date  . Dysfunction of sleep stage or arousal   . DJD (degenerative joint disease)     knees  . Meningeal disorder   . Hypertension      Social History   Social History  . Marital Status: Married    Spouse Name: N/A  . Number of Children: N/A  . Years of Education: N/A   Occupational History  . Not on file.   Social History Main Topics  . Smoking status: Never Smoker   . Smokeless tobacco: Never Used  . Alcohol Use: 1.8 oz/week    3 Cans of beer per week  . Drug  Use: No  . Sexual Activity: Not on file   Other Topics Concern  . Not on file   Social History Narrative    No past surgical history on file.  Family History  Problem Relation Age of Onset  . Hypertension Other     No Known Allergies  Current Outpatient Prescriptions on File Prior to Visit  Medication Sig Dispense Refill  . citalopram (CELEXA) 20 MG tablet TAKE ONE AND ONE-HALF TABLETS BY MOUTH ONCE DAILY. NEED OFFICE VISIT FOR MORE REFILLS 135 tablet 0  . lisinopril (PRINIVIL,ZESTRIL) 20 MG tablet Take 1 tablet (20 mg total) by mouth daily. 90 tablet 1   No current facility-administered medications on file prior to visit.    BP 130/90 mmHg  Pulse 64  Temp(Src) 98.3 F (36.8 C) (Oral)  Ht 5' 9.5" (1.765 m)  Wt 199 lb (90.266 kg)  BMI 28.98 kg/m2  SpO2 97%       Objective:   Physical Exam  Constitutional: He is oriented to person, place, and time. He appears well-developed and well-nourished.  HENT:  Right Ear: Tympanic membrane normal.  Left Ear: Tympanic membrane normal.  Nose: No rhinorrhea. Right sinus exhibits no maxillary sinus tenderness and no frontal sinus tenderness. Left sinus exhibits no maxillary sinus tenderness and no frontal sinus tenderness.  Mouth/Throat: Mucous membranes are normal. No oropharyngeal exudate or posterior oropharyngeal erythema.  Eyes: Pupils are equal, round, and reactive  to light. No scleral icterus.  Neck: Neck supple.  Cardiovascular: Normal rate and regular rhythm.   Pulmonary/Chest: Effort normal and breath sounds normal. He has no wheezes. He has no rales.  Lymphadenopathy:    He has no cervical adenopathy.  Neurological: He is alert and oriented to person, place, and time.  Skin: Skin is warm and dry. No rash noted.       Assessment & Plan:  1. Sore throat Exam and history support sore throat which is resolving after treatment for acute tonsillitis. Advised patient to continue salt water gargles and use tylenol for  discomfort as needed. We discussed the possibility of ENT referral if symptoms do not improve or reoccur in 4 weeks with conservative, supportive measures due to 3rd treatment of sore throat in less than one year. Further advised patient to follow up if symptoms of throat pain increases or he notices exudate on his tonsils or  he develops a fever >101.  Patient voiced understanding and agreed with plan.  Advised patient to follow up with his PCP if he is experiencing a consistent cough that may be related to ACE I therapy.  Delano Metz, FNP-C

## 2015-12-06 NOTE — Patient Instructions (Signed)
Continue salt water rinses and if symptoms return or do not continue to improve, an ENT referral will be place.

## 2016-02-07 ENCOUNTER — Encounter: Payer: Self-pay | Admitting: Adult Health

## 2016-02-07 ENCOUNTER — Ambulatory Visit (INDEPENDENT_AMBULATORY_CARE_PROVIDER_SITE_OTHER): Payer: BLUE CROSS/BLUE SHIELD | Admitting: Adult Health

## 2016-02-07 VITALS — BP 110/70 | Temp 98.2°F | Ht 69.5 in | Wt 201.4 lb

## 2016-02-07 DIAGNOSIS — R05 Cough: Secondary | ICD-10-CM | POA: Diagnosis not present

## 2016-02-07 DIAGNOSIS — Z760 Encounter for issue of repeat prescription: Secondary | ICD-10-CM | POA: Diagnosis not present

## 2016-02-07 DIAGNOSIS — R059 Cough, unspecified: Secondary | ICD-10-CM

## 2016-02-07 MED ORDER — CITALOPRAM HYDROBROMIDE 20 MG PO TABS: ORAL_TABLET | ORAL | 0 refills | Status: DC

## 2016-02-07 MED ORDER — LISINOPRIL 20 MG PO TABS: 20.0000 mg | ORAL_TABLET | Freq: Every day | ORAL | 1 refills | Status: DC

## 2016-02-07 NOTE — Patient Instructions (Signed)
It was great seeing you again!  I will refer you to ENT   I would also like to start taking over the counter Prilosec. If you do not notice a difference in a week or so, please let me know and we will change your blood pressure medication.   Someone from the ENT office will call you

## 2016-02-07 NOTE — Progress Notes (Signed)
Subjective:    Patient ID: John Bowen, male    DOB: 08/30/1955, 60 y.o.   MRN: [id number]  HPI   60 year old male who presents to the office today for a dry cough that he has had for close to one year as well as the feeling of " something is on the back of my uvula." This has been a constant feeling for the last " few months." he has been treated numerous times in the last year for various throat pain.   He denies any rhinitis, post nasal drip, sinus pressure/pain, ear pain, fever, chills, sweats, N/V/D  He does report having a sour taste in his mouth when he wakes up in the morning. He does take Tums when he feels as though he is having an acid reflux and that works pretty well for him.   There is some concern that lisinopril is causing this dry cough    Review of Systems  Constitutional: Negative.   HENT: Negative.  Negative for sore throat, trouble swallowing and voice change.   Respiratory: Positive for cough.   Cardiovascular: Negative.   Gastrointestinal: Negative.   Neurological: Negative.   Hematological: Negative.   All other systems reviewed and are negative.  Past Medical History:  Diagnosis Date  . DJD (degenerative joint disease)    knees  . Dysfunction of sleep stage or arousal   . Hypertension   . Meningeal disorder     Social History   Social History  . Marital status: Married    Spouse name: N/A  . Number of children: N/A  . Years of education: N/A   Occupational History  . Not on file.   Social History Main Topics  . Smoking status: Never Smoker  . Smokeless tobacco: Never Used  . Alcohol use 1.8 oz/week    3 Cans of beer per week  . Drug use: No  . Sexual activity: Not on file   Other Topics Concern  . Not on file   Social History Narrative  . No narrative on file    No past surgical history on file.  Family History  Problem Relation Age of Onset  . Hypertension Other     No Known Allergies  Current Outpatient Prescriptions  on File Prior to Visit  Medication Sig Dispense Refill  . citalopram (CELEXA) 20 MG tablet TAKE ONE AND ONE-HALF TABLETS BY MOUTH ONCE DAILY. NEED OFFICE VISIT FOR MORE REFILLS 135 tablet 0  . lisinopril (PRINIVIL,ZESTRIL) 20 MG tablet Take 1 tablet (20 mg total) by mouth daily. 90 tablet 1   No current facility-administered medications on file prior to visit.     BP 110/70   Temp 98.2 F (36.8 C) (Oral)   Ht 5' 9.5" (1.765 m)   Wt 201 lb 6.4 oz (91.4 kg)   BMI 29.32 kg/m        Objective:   Physical Exam  Constitutional: He is oriented to person, place, and time. He appears well-developed and well-nourished. No distress.  HENT:  Head: Normocephalic and atraumatic.  Right Ear: External ear normal.  Left Ear: External ear normal.  Nose: Nose normal.  Mouth/Throat: Oropharynx is clear and moist. No oropharyngeal exudate.  Eyes: Conjunctivae and EOM are normal. Pupils are equal, round, and reactive to light. Right eye exhibits no discharge. Left eye exhibits no discharge.  Neck: Normal range of motion. Neck supple. No thyromegaly present.  Cardiovascular: Normal rate, regular rhythm, normal heart sounds and intact distal pulses.  Exam reveals no gallop and no friction rub.   No murmur heard. Pulmonary/Chest: Effort normal and breath sounds normal. No respiratory distress. He has no wheezes. He has no rales. He exhibits no tenderness.  Musculoskeletal: Normal range of motion. He exhibits no edema, tenderness or deformity.  Lymphadenopathy:    He has no cervical adenopathy.  Neurological: He is alert and oriented to person, place, and time. He has normal reflexes. He displays normal reflexes. No cranial nerve deficit. He exhibits normal muscle tone. Coordination normal.  Skin: Skin is warm and dry. No rash noted. He is not diaphoretic. No erythema. No pallor.  Psychiatric: He has a normal mood and affect. His behavior is normal. Judgment and thought content normal.  Nursing note and  vitals reviewed.     Assessment & Plan:  1. Cough - Possibly related to lisinopril or GERD. Will refer to ENT as well  - Ambulatory referral to ENT - Asked to try OTC Prilosec to see if that helps his cough  - Follow up if no improvement and at that time we will change lisinopril   2. Medication refill - lisinopril (PRINIVIL,ZESTRIL) 20 MG tablet; Take 1 tablet (20 mg total) by mouth daily.  Dispense: 90 tablet; Refill: 1 - citalopram (CELEXA) 20 MG tablet; TAKE ONE AND ONE-HALF TABLETS BY MOUTH ONCE DAILY. NEED OFFICE VISIT FOR MORE REFILLS  Dispense: 135 tablet; Refill: 0  Dorothyann Peng, NP

## 2016-02-09 ENCOUNTER — Other Ambulatory Visit: Payer: Self-pay | Admitting: Family Medicine

## 2016-02-10 DIAGNOSIS — R1313 Dysphagia, pharyngeal phase: Secondary | ICD-10-CM | POA: Diagnosis not present

## 2016-02-10 DIAGNOSIS — J3501 Chronic tonsillitis: Secondary | ICD-10-CM | POA: Diagnosis not present

## 2016-02-10 NOTE — Telephone Encounter (Signed)
Prescription has expired.

## 2016-05-21 ENCOUNTER — Other Ambulatory Visit: Payer: Self-pay | Admitting: Adult Health

## 2016-05-21 DIAGNOSIS — Z760 Encounter for issue of repeat prescription: Secondary | ICD-10-CM

## 2016-05-23 ENCOUNTER — Other Ambulatory Visit: Payer: Self-pay | Admitting: Adult Health

## 2016-05-23 ENCOUNTER — Other Ambulatory Visit: Payer: Self-pay | Admitting: Emergency Medicine

## 2016-05-23 DIAGNOSIS — Z760 Encounter for issue of repeat prescription: Secondary | ICD-10-CM

## 2016-05-23 NOTE — Telephone Encounter (Signed)
Dr. Todd patient  

## 2016-05-23 NOTE — Telephone Encounter (Signed)
Okay to refill? 

## 2016-05-23 NOTE — Telephone Encounter (Signed)
I have only seen him for an acute visit. Still Dr. Sherren Mocha patient.

## 2016-06-13 ENCOUNTER — Encounter: Payer: Self-pay | Admitting: Family Medicine

## 2016-06-13 ENCOUNTER — Ambulatory Visit (INDEPENDENT_AMBULATORY_CARE_PROVIDER_SITE_OTHER): Payer: BLUE CROSS/BLUE SHIELD | Admitting: Family Medicine

## 2016-06-13 VITALS — BP 140/80 | HR 67 | Temp 98.2°F | Ht 69.0 in | Wt 204.5 lb

## 2016-06-13 DIAGNOSIS — I10 Essential (primary) hypertension: Secondary | ICD-10-CM

## 2016-06-13 DIAGNOSIS — Z760 Encounter for issue of repeat prescription: Secondary | ICD-10-CM

## 2016-06-13 DIAGNOSIS — G4700 Insomnia, unspecified: Secondary | ICD-10-CM

## 2016-06-13 DIAGNOSIS — N486 Induration penis plastica: Secondary | ICD-10-CM | POA: Diagnosis not present

## 2016-06-13 DIAGNOSIS — M1732 Unilateral post-traumatic osteoarthritis, left knee: Secondary | ICD-10-CM

## 2016-06-13 LAB — CBC WITH DIFFERENTIAL/PLATELET
Basophils Absolute: 0 10*3/uL (ref 0.0–0.1)
Basophils Relative: 0.3 % (ref 0.0–3.0)
Eosinophils Absolute: 0.1 10*3/uL (ref 0.0–0.7)
Eosinophils Relative: 2.8 % (ref 0.0–5.0)
HEMATOCRIT: 42.5 % (ref 39.0–52.0)
HEMOGLOBIN: 14.6 g/dL (ref 13.0–17.0)
Lymphocytes Relative: 31.2 % (ref 12.0–46.0)
Lymphs Abs: 1.6 10*3/uL (ref 0.7–4.0)
MCHC: 34.3 g/dL (ref 30.0–36.0)
MCV: 89 fl (ref 78.0–100.0)
MONO ABS: 0.5 10*3/uL (ref 0.1–1.0)
Monocytes Relative: 9.6 % (ref 3.0–12.0)
Neutro Abs: 3 10*3/uL (ref 1.4–7.7)
Neutrophils Relative %: 56.1 % (ref 43.0–77.0)
Platelets: 271 10*3/uL (ref 150.0–400.0)
RBC: 4.78 Mil/uL (ref 4.22–5.81)
RDW: 13.3 % (ref 11.5–15.5)
WBC: 5.3 10*3/uL (ref 4.0–10.5)

## 2016-06-13 LAB — POCT URINALYSIS DIPSTICK
BILIRUBIN UA: NEGATIVE
Blood, UA: NEGATIVE
GLUCOSE UA: NEGATIVE
Ketones, UA: NEGATIVE
Leukocytes, UA: NEGATIVE
Nitrite, UA: NEGATIVE
Protein, UA: NEGATIVE
Urobilinogen, UA: 0.2
pH, UA: 6.5

## 2016-06-13 LAB — LIPID PANEL
CHOLESTEROL: 213 mg/dL — AB (ref 0–200)
HDL: 48.2 mg/dL (ref >39.00)
LDL CALC: 129 mg/dL — AB (ref 0–99)
NonHDL: 165.15
Total CHOL/HDL Ratio: 4
Triglycerides: 180 mg/dL — ABNORMAL HIGH (ref 0.0–149.0)
VLDL: 36 mg/dL (ref 0.0–40.0)

## 2016-06-13 LAB — HEPATIC FUNCTION PANEL
ALT: 34 U/L (ref 0–53)
AST: 21 U/L (ref 0–37)
Albumin: 4.3 g/dL (ref 3.5–5.2)
Alkaline Phosphatase: 66 U/L (ref 39–117)
BILIRUBIN DIRECT: 0.1 mg/dL (ref 0.0–0.3)
BILIRUBIN TOTAL: 0.5 mg/dL (ref 0.2–1.2)
Total Protein: 6.4 g/dL (ref 6.0–8.3)

## 2016-06-13 LAB — BASIC METABOLIC PANEL
BUN: 14 mg/dL (ref 6–23)
CHLORIDE: 107 meq/L (ref 96–112)
CO2: 28 mEq/L (ref 19–32)
CREATININE: 0.82 mg/dL (ref 0.40–1.50)
Calcium: 9 mg/dL (ref 8.4–10.5)
GFR: 100.87 mL/min (ref >60.00)
GLUCOSE: 84 mg/dL (ref 70–99)
Potassium: 3.9 mEq/L (ref 3.5–5.1)
Sodium: 141 mEq/L (ref 135–145)

## 2016-06-13 LAB — TSH: TSH: 0.98 u[IU]/mL (ref 0.35–4.50)

## 2016-06-13 LAB — PSA: PSA: 1.38 ng/mL (ref 0.10–4.00)

## 2016-06-13 MED ORDER — LISINOPRIL 20 MG PO TABS: 20.0000 mg | ORAL_TABLET | Freq: Every day | ORAL | 4 refills | Status: DC

## 2016-06-13 MED ORDER — TRAZODONE HCL 50 MG PO TABS: 25.0000 mg | ORAL_TABLET | Freq: Every evening | ORAL | 4 refills | Status: DC | PRN

## 2016-06-13 MED ORDER — SILDENAFIL CITRATE 20 MG PO TABS: ORAL_TABLET | ORAL | 11 refills | Status: DC

## 2016-06-13 NOTE — Progress Notes (Signed)
John Bowen is a 61 year old married male nonsmoker who comes in today for evaluation of multiple issues  He's taken Celexa 30 mg a day at bedtime for many years because a sleep dysfunction. He's been offered now for a month because it didn't seem to help. He says his wife tells him that he snores and makes a lot of noise at night. His brother has a history of sleep apnea.  He has hypertension. He is on lisinopril 20 mg daily. He stopped it for 2 weeks and restarted 4 days ago. When he stopped his medication BP went up to 160/96. After dose daily for 4 days his blood pressures down to 140/80.  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 We'll give him a trial of generic Viagra  He's also had difficulty with his left knee for many years. He played football in college. It was recommended that he get a total knee replacement 13 years ago. He's now ready to get it done. I recommend he see Dr. Catalina Antigua: Or Dr. Pilar Plate A full orthopedic evaluation.  BP 140/80 (BP Location: Left Arm, Patient Position: Sitting, Cuff Size: Normal)   Pulse 67   Temp 98.2 F (36.8 C) (Oral)   Ht 5\' 9"  (1.753 m)   Wt 204 lb 8 oz (92.8 kg)   BMI 30.20 kg/m  No exam done today  Impression  #1 hypertension......... continue daily medicine....... BP check daily......... BP goal 130/80 or less........ CPX for 6 weeks  #2 sleep dysfunction....... trial of trazodone 50 mg daily at bedtime along with pulmonary consult to rule out sleep apnea  #3 Perrone's disease........Marland Kitchen generic Viagra  #4 degenerative joint disease left knee..... Orthopedic evaluation.Marland Kitchen

## 2016-06-13 NOTE — Patient Instructions (Signed)
Lisinopril 20 mg........Marland Kitchen 1 daily in the morning for high blood pressure  Trazodone 50 mg.............. one daily at bedtime to help with sleep  We were requested pulmonary consult to see if sleep apnea might be a factor in your sleep dysfunction  Call orthopedics and get set up for evaluation of your left knee  Labs today............ I will call you if there is anything abnormal  Omron pump up digital pressure cuff.........Marland Kitchen [employer]  Check your blood pressure daily in the morning  Return in 4-6 weeks for physical exam.................Marland Kitchen bring a copy of all your blood pressure readings and the new device when you return.

## 2016-07-28 ENCOUNTER — Institutional Professional Consult (permissible substitution): Payer: BLUE CROSS/BLUE SHIELD | Admitting: Pulmonary Disease

## 2016-08-01 ENCOUNTER — Ambulatory Visit (INDEPENDENT_AMBULATORY_CARE_PROVIDER_SITE_OTHER): Payer: BLUE CROSS/BLUE SHIELD | Admitting: Family Medicine

## 2016-08-01 ENCOUNTER — Encounter: Payer: Self-pay | Admitting: Family Medicine

## 2016-08-01 VITALS — BP 140/84 | Temp 98.5°F | Ht 68.75 in | Wt 205.0 lb

## 2016-08-01 DIAGNOSIS — M1732 Unilateral post-traumatic osteoarthritis, left knee: Secondary | ICD-10-CM | POA: Diagnosis not present

## 2016-08-01 DIAGNOSIS — Z0000 Encounter for general adult medical examination without abnormal findings: Secondary | ICD-10-CM

## 2016-08-01 DIAGNOSIS — N486 Induration penis plastica: Secondary | ICD-10-CM | POA: Diagnosis not present

## 2016-08-01 DIAGNOSIS — I10 Essential (primary) hypertension: Secondary | ICD-10-CM

## 2016-08-01 DIAGNOSIS — G4700 Insomnia, unspecified: Secondary | ICD-10-CM | POA: Diagnosis not present

## 2016-08-01 DIAGNOSIS — Z23 Encounter for immunization: Secondary | ICD-10-CM | POA: Diagnosis not present

## 2016-08-01 MED ORDER — LISINOPRIL 40 MG PO TABS: 40.0000 mg | ORAL_TABLET | Freq: Every day | ORAL | 4 refills | Status: DC

## 2016-08-01 MED ORDER — TRAZODONE HCL 50 MG PO TABS: ORAL_TABLET | ORAL | 4 refills | Status: DC

## 2016-08-01 NOTE — Patient Instructions (Signed)
Increase the lisinopril to 40 mg daily  Check your blood pressure daily in the morning for 4 weeks........... if 40 mg is the appropriate dose and your blood pressure should drop to normal.........Marland Kitchen 135/85 or less.....Marland Kitchen if not call and we will increase your dose to 60 mg daily  Call the urology center and make an appointment to See one the urologist  Call your insurance And find out where you can get the shingles vaccine

## 2016-08-01 NOTE — Progress Notes (Signed)
John Bowen is a 61 year old married male nonsmoker who comes in today for general physical examination  He has a history of underlying hypertension. He takes his lisinopril 20 mg daily at bedtime. Blood pressures are not at goal. We'll increase lisinopril to 40 mg daily  He takes trazodone 50 mg at bedtime for sleep dysfunction.  He said some history of mild ED he tried some generic Viagra 20 mg however he has peronies disease and has been having painful erections and ejaculations. We'll get him set up to see the urologist  He gets routine eye care, dental care, colonoscopy 8 years ago was normal.  Vaccinations tetanus booster last December 2006. He'll therefore be given a tetanus booster today. Flu shot 2017. He did have the shingles one time and had some secondary zoster type meningitis. He recovered without sequelae. I recommend he also get the new shingles vaccine.  He's having difficulty exercising. He tore the cartilages in his left knee playing football in college. No areas bone rubbing on bone and cannot walk very far without severe pain. He's due to see Dr. Catalina Antigua or frank at [facility] him of this month for evaluation. I would recommend this juncture that he get a total knee replacement on the left. He had cartilage removed from his right knee.  14 point review of systems reviewed and otherwise negative  Social history..... 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  Impression #1 healthy male  #2 hypertension not at goal........ increase lisinopril to 40 mg daily follow-up in 4 weeks of blood pressure not normal  #3 severe degenerative joint disease left knee.......Marland Kitchen recommend total knee replacement ASAP  #4 sleep dysfunction....... continue trazodone 50 mg at bedtime

## 2016-08-01 NOTE — Progress Notes (Signed)
Pre visit review using our clinic review tool, if applicable. No additional management support is needed unless otherwise documented below in the visit note. 

## 2016-08-18 DIAGNOSIS — M1712 Unilateral primary osteoarthritis, left knee: Secondary | ICD-10-CM | POA: Diagnosis not present

## 2016-09-05 DIAGNOSIS — M1712 Unilateral primary osteoarthritis, left knee: Secondary | ICD-10-CM | POA: Diagnosis not present

## 2016-09-06 ENCOUNTER — Ambulatory Visit (INDEPENDENT_AMBULATORY_CARE_PROVIDER_SITE_OTHER): Payer: BLUE CROSS/BLUE SHIELD | Admitting: Pulmonary Disease

## 2016-09-06 ENCOUNTER — Encounter: Payer: Self-pay | Admitting: Pulmonary Disease

## 2016-09-06 DIAGNOSIS — G4733 Obstructive sleep apnea (adult) (pediatric): Secondary | ICD-10-CM

## 2016-09-06 NOTE — Progress Notes (Signed)
   Subjective:    Patient ID: John Bowen, male    DOB: 1956-02-22, 61 y.o.   MRN: [id number]  HPI  Chief Complaint  Patient presents with  . Sleep Consult    60 year old presents for evaluation of sleep-disordered breathing. His wife has reported loud snoring for many years and witnessed apneas. He reports restlessness in his left leg. He is scheduled for left total knee replacement on 24th of April. He reports trouble getting to sleep due to back issues. Epworth sleepiness score is NA reports sleepiness while watching TV and sitting and reading. He takes occasional 45 minute naps on the couch on weekends. Bedtime is between 10:30 and 11 PM, sleep latency varies from 30-60 minutes, he sleeps in various positions with one pillow under his head, reports 2-3 nocturnal awakenings including nocturia and is out of bed by 8 AM feeling rested but occasional dryness of mouth. His weight has remained constant for many years  There is no history suggestive of cataplexy, sleep paralysis or parasomnias  His hypertension is controlled on lisinopril. He is a lifetime never smoker. He drinks 1-2 beers in the evening    Past Medical History:  Diagnosis Date  . DJD (degenerative joint disease)    knees  . Dysfunction of sleep stage or arousal   . Hypertension   . Meningeal disorder       No past surgical history on file.  No Known Allergies  Social History   Social History  . Marital status: Married    Spouse name: N/A  . Number of children: N/A  . Years of education: N/A   Occupational History  . Not on file.   Social History Main Topics  . Smoking status: Never Smoker  . Smokeless tobacco: Never Used  . Alcohol use 1.8 oz/week    3 Cans of beer per week  . Drug use: No  . Sexual activity: Not on file   Other Topics Concern  . Not on file   Social History Narrative  . No narrative on file     Family History  Problem Relation Age of Onset  . Hypertension Other       Review of Systems Constitutional: negative for anorexia, fevers and sweats  Eyes: negative for irritation, redness and visual disturbance  Ears, nose, mouth, throat, and face: negative for earaches, epistaxis, nasal congestion and sore throat  Respiratory: negative for cough, dyspnea on exertion, sputum and wheezing  Cardiovascular: negative for chest pain, dyspnea, lower extremity edema, orthopnea, palpitations and syncope  Gastrointestinal: negative for abdominal pain, constipation, diarrhea, melena, nausea and vomiting  Genitourinary:negative for dysuria, frequency and hematuria  Hematologic/lymphatic: negative for bleeding, easy bruising and lymphadenopathy  Musculoskeletal:negative for arthralgias, muscle weakness and stiff joints  Neurological: negative for coordination problems, gait problems, headaches and weakness  Endocrine: negative for diabetic symptoms including polydipsia, polyuria and weight loss     Objective:   Physical Exam  Gen. Pleasant, well-nourished, in no distress, normal affect ENT - class 2 airway, no post nasal drip Neck: No JVD, no thyromegaly, no carotid bruits Lungs: no use of accessory muscles, no dullness to percussion, clear without rales or rhonchi  Cardiovascular: Rhythm regular, heart sounds  normal, no murmurs or gallops, no peripheral edema Abdomen: soft and non-tender, no hepatosplenomegaly, BS normal. Musculoskeletal: No deformities, no cyanosis or clubbing Neuro:  alert, non focal       Assessment & Plan:

## 2016-09-06 NOTE — Assessment & Plan Note (Signed)
Given excessive daytime somnolence,  witnessed apneas & loud snoring, obstructive sleep apnea is possible & an overnight polysomnogram will be scheduled as a home study. The pathophysiology of obstructive sleep apnea , it's cardiovascular consequences & modes of treatment including CPAP were discused with the patient in detail & they evidenced understanding.  We discussed precautions during and after surgery

## 2016-09-06 NOTE — Patient Instructions (Addendum)
Schedule home sleep study We discussed treatment options We discussed precautions during and after surgery

## 2016-09-19 DIAGNOSIS — M179 Osteoarthritis of knee, unspecified: Secondary | ICD-10-CM | POA: Diagnosis not present

## 2016-09-19 DIAGNOSIS — M1712 Unilateral primary osteoarthritis, left knee: Secondary | ICD-10-CM | POA: Diagnosis not present

## 2016-09-19 DIAGNOSIS — Z96651 Presence of right artificial knee joint: Secondary | ICD-10-CM | POA: Diagnosis not present

## 2016-09-25 DIAGNOSIS — M1712 Unilateral primary osteoarthritis, left knee: Secondary | ICD-10-CM | POA: Diagnosis not present

## 2016-09-27 DIAGNOSIS — M1712 Unilateral primary osteoarthritis, left knee: Secondary | ICD-10-CM | POA: Diagnosis not present

## 2016-09-29 DIAGNOSIS — M1712 Unilateral primary osteoarthritis, left knee: Secondary | ICD-10-CM | POA: Diagnosis not present

## 2016-10-02 DIAGNOSIS — M1712 Unilateral primary osteoarthritis, left knee: Secondary | ICD-10-CM | POA: Diagnosis not present

## 2016-10-04 DIAGNOSIS — M1712 Unilateral primary osteoarthritis, left knee: Secondary | ICD-10-CM | POA: Diagnosis not present

## 2016-10-05 ENCOUNTER — Telehealth: Payer: Self-pay | Admitting: Family Medicine

## 2016-10-05 DIAGNOSIS — N486 Induration penis plastica: Secondary | ICD-10-CM

## 2016-10-05 NOTE — Telephone Encounter (Signed)
° °  Pt said the last time he was in Dr Sherren Mocha was going to refer him to a Dealer. He said Dr Sherren Mocha told him he had some type if disease he could not remember what the disease was called

## 2016-10-06 DIAGNOSIS — M1712 Unilateral primary osteoarthritis, left knee: Secondary | ICD-10-CM | POA: Diagnosis not present

## 2016-10-06 NOTE — Telephone Encounter (Signed)
Referral placed in the system for urology.

## 2016-10-09 DIAGNOSIS — M1712 Unilateral primary osteoarthritis, left knee: Secondary | ICD-10-CM | POA: Diagnosis not present

## 2016-10-11 DIAGNOSIS — M1712 Unilateral primary osteoarthritis, left knee: Secondary | ICD-10-CM | POA: Diagnosis not present

## 2016-10-16 DIAGNOSIS — M1712 Unilateral primary osteoarthritis, left knee: Secondary | ICD-10-CM | POA: Diagnosis not present

## 2016-10-17 DIAGNOSIS — G4733 Obstructive sleep apnea (adult) (pediatric): Secondary | ICD-10-CM | POA: Diagnosis not present

## 2016-10-18 DIAGNOSIS — M1712 Unilateral primary osteoarthritis, left knee: Secondary | ICD-10-CM | POA: Diagnosis not present

## 2016-10-19 ENCOUNTER — Telehealth: Payer: Self-pay | Admitting: Pulmonary Disease

## 2016-10-19 DIAGNOSIS — G4733 Obstructive sleep apnea (adult) (pediatric): Secondary | ICD-10-CM

## 2016-10-19 NOTE — Telephone Encounter (Signed)
Trazodone generally does not affect OSA Severity is high enough that we should proceed with CPAP titration

## 2016-10-19 NOTE — Telephone Encounter (Signed)
Per RA, HST showed severe OSA with 35 events per hour. Suggests a CPAP titration study next.

## 2016-10-19 NOTE — Telephone Encounter (Signed)
Spoke with patient regarding results. He verbalized understanding. He wanted you to know that after his test, he had just finished a round of physical therapy for his knee, so he was very restless for the first 2 hours. He also stated that he had taken trazodone for his knee pain. He wanted to know if any of these would have any effects on his results.   Will hold off on ordering CPAP titration until you respond. Thanks!

## 2016-10-20 ENCOUNTER — Other Ambulatory Visit: Payer: Self-pay | Admitting: *Deleted

## 2016-10-20 DIAGNOSIS — G4733 Obstructive sleep apnea (adult) (pediatric): Secondary | ICD-10-CM | POA: Diagnosis not present

## 2016-10-20 DIAGNOSIS — M1712 Unilateral primary osteoarthritis, left knee: Secondary | ICD-10-CM | POA: Diagnosis not present

## 2016-10-26 NOTE — Telephone Encounter (Signed)
Patient will proceed with CPAP titration. He is scheduled to have another procedure done on his knee on June 5th. He wants to wait until after he has healed to have this study done. Will placed this in the scheduling info.   Order will be placed today. Nothing further needed.

## 2016-10-27 ENCOUNTER — Telehealth: Payer: Self-pay | Admitting: Pulmonary Disease

## 2016-10-27 DIAGNOSIS — G4733 Obstructive sleep apnea (adult) (pediatric): Secondary | ICD-10-CM

## 2016-10-27 NOTE — Telephone Encounter (Signed)
-----   Message from Joellen Jersey sent at 10/27/2016  3:31 PM EDT ----- Need to order auto cpap on this pt 1st before cpap study thanks

## 2016-10-27 NOTE — Telephone Encounter (Signed)
   Prescription to DME for Auto CPAP 5-15 cm, mask of choice, humidity Download in 4 weeks Follow-up office visit with TB/me in 6 weeks

## 2016-11-02 NOTE — Telephone Encounter (Signed)
LMTCB x1  Will place order once patient is aware.

## 2016-11-03 NOTE — Telephone Encounter (Signed)
Pt returned phone call, pt contact # [phone].John Bowen

## 2016-11-03 NOTE — Telephone Encounter (Signed)
Left another message for patient to call back 

## 2016-11-03 NOTE — Telephone Encounter (Signed)
Pt returning call

## 2016-11-07 NOTE — Telephone Encounter (Signed)
Pt is aware of below message and voiced his understanding. Pt has been scheduled for 6wk rov with TP on 12/29/16 @ 10:15 Order has been placed. Nothing further needed.

## 2016-11-16 ENCOUNTER — Encounter (HOSPITAL_BASED_OUTPATIENT_CLINIC_OR_DEPARTMENT_OTHER): Payer: BLUE CROSS/BLUE SHIELD

## 2016-11-30 ENCOUNTER — Ambulatory Visit (INDEPENDENT_AMBULATORY_CARE_PROVIDER_SITE_OTHER): Payer: BLUE CROSS/BLUE SHIELD | Admitting: Family Medicine

## 2016-11-30 ENCOUNTER — Encounter: Payer: Self-pay | Admitting: Family Medicine

## 2016-11-30 VITALS — BP 128/60 | HR 72 | Temp 98.2°F | Ht 68.75 in | Wt 186.5 lb

## 2016-11-30 DIAGNOSIS — R5383 Other fatigue: Secondary | ICD-10-CM | POA: Diagnosis not present

## 2016-11-30 DIAGNOSIS — R6883 Chills (without fever): Secondary | ICD-10-CM

## 2016-11-30 NOTE — Progress Notes (Signed)
HPI:  Acute visit for abnormal temperature: [place], got suburned a little and when got home yesterday felt tired and chilled -check temp and was low on home digital thermometer -he had knee surgery 8 weeks ago so called his ortho doctor this morning as was worried the low temp could have meant infection -his knee" looks much better lately" then in the past with less swelling, pain improving and no redness -he has been drinking fluids today and last night -today he feels better -denies: fever, SOB, Cough, diarrhea, nausea, vomiting, tick bites,worsening pain or swelling in knee redness in knee, skin rash -he has stopped his tramadol as pain has improved   ROS: See pertinent positives and negatives per HPI.  Past Medical History:  Diagnosis Date  . DJD (degenerative joint disease)    knees  . Dysfunction of sleep stage or arousal   . Hypertension   . Meningeal disorder     No past surgical history on file.  Family History  Problem Relation Age of Onset  . Hypertension Other     Social History   Social History  . Marital status: Married    Spouse name: N/A  . Number of children: N/A  . Years of education: N/A   Social History Main Topics  . Smoking status: Never Smoker  . Smokeless tobacco: Never Used  . Alcohol use 1.8 oz/week    3 Cans of beer per week  . Drug use: No  . Sexual activity: Not Asked   Other Topics Concern  . None   Social History Narrative  . None     Current Outpatient Prescriptions:  .  gabapentin (NEURONTIN) 300 MG capsule, Take 300 mg by mouth 3 (three) times daily. , Disp: , Rfl: 0 .  lisinopril (PRINIVIL,ZESTRIL) 40 MG tablet, Take 1 tablet (40 mg total) by mouth daily., Disp: 90 tablet, Rfl: 4 .  methocarbamol (ROBAXIN) 500 MG tablet, every 8 (eight) hours as needed. , Disp: , Rfl: 1 .  traMADol (ULTRAM) 50 MG tablet, every 6 (six) hours as needed. , Disp: , Rfl: 0 .   traZODone (DESYREL) 50 MG tablet, 1 tablet at bedtime, Disp: 100 tablet, Rfl: 4  EXAM:  Vitals:   11/30/16 1521  BP: 128/60  Pulse: 72  Temp: 98.2 F (36.8 C)    Body mass index is 27.74 kg/m.  GENERAL: vitals reviewed and listed above, alert, oriented, appears well hydrated and in no acute distress  HEENT: atraumatic, conjunttiva clear, no obvious abnormalities on inspection of external nose and ears, normal appearance of ear canals and TMs, clear nasal congestion, mild post oropharyngeal erythema with PND, no tonsillar edema or exudate, no sinus TTP  NECK: no obvious masses on inspection  LUNGS: clear to auscultation bilaterally, no wheezes, rales or rhonchi, good air movement  CV: HRRR, no peripheral edema  MS: moves all extremities without noticeable abnormality, healed surgical wound L knee - no redness or drainage, decent ROM knee, swelling knee joint  PSYCH: pleasant and cooperative, no obvious depression or anxiety  ASSESSMENT AND PLAN:  Discussed the following assessment and plan:  Chills  Low energy  -we discussed possible serious and likely etiologies, workup and treatment, treatment risks and return precautions -looks well hydrated today and vitals ok today and he is feeling better -knee is swollen but pt feels is better then in the past -advised eval with ortho for any knee concerns and  advised of ortho urgent care -temp ok here -  advised continued oral hydration and avoiding heat or strenuous activity -advise close monitoring and prompt eval at [facility] over weekend if any recurring concerns -Patient advised to return or notify a doctor immediately if symptoms worsen or persist or new concerns arise.  There are no Patient Instructions on file for this visit.  Colin Benton R., DO

## 2016-12-01 DIAGNOSIS — L821 Other seborrheic keratosis: Secondary | ICD-10-CM | POA: Diagnosis not present

## 2016-12-01 DIAGNOSIS — D225 Melanocytic nevi of trunk: Secondary | ICD-10-CM | POA: Diagnosis not present

## 2016-12-01 DIAGNOSIS — D1801 Hemangioma of skin and subcutaneous tissue: Secondary | ICD-10-CM | POA: Diagnosis not present

## 2016-12-01 DIAGNOSIS — D2261 Melanocytic nevi of right upper limb, including shoulder: Secondary | ICD-10-CM | POA: Diagnosis not present

## 2016-12-18 DIAGNOSIS — N486 Induration penis plastica: Secondary | ICD-10-CM | POA: Diagnosis not present

## 2016-12-20 DIAGNOSIS — G4733 Obstructive sleep apnea (adult) (pediatric): Secondary | ICD-10-CM | POA: Diagnosis not present

## 2016-12-27 ENCOUNTER — Encounter (HOSPITAL_BASED_OUTPATIENT_CLINIC_OR_DEPARTMENT_OTHER): Payer: BLUE CROSS/BLUE SHIELD

## 2016-12-28 ENCOUNTER — Telehealth: Payer: Self-pay | Admitting: Adult Health

## 2016-12-28 DIAGNOSIS — N486 Induration penis plastica: Secondary | ICD-10-CM | POA: Diagnosis not present

## 2016-12-28 DIAGNOSIS — M25662 Stiffness of left knee, not elsewhere classified: Secondary | ICD-10-CM | POA: Diagnosis not present

## 2016-12-28 NOTE — Telephone Encounter (Signed)
Patient was originally scheduled with TP on 12/29/16 for CPAP compliance. Spoke with Jonni Sanger at [facility] who stated that the patient had been setup on his CPAP on July 25th, thus this visit is too early. Jonni Sanger went ahead and enrolled the patient into [facility].   Spoke with patient. He is aware of the appt switch. The 12/29/16 appt has been rescheduled for 02/06/17 at [facility]. He verbalized understanding. Nothing further needed at time of call.

## 2016-12-29 ENCOUNTER — Ambulatory Visit: Payer: BLUE CROSS/BLUE SHIELD | Admitting: Adult Health

## 2017-01-01 DIAGNOSIS — M25662 Stiffness of left knee, not elsewhere classified: Secondary | ICD-10-CM | POA: Diagnosis not present

## 2017-01-03 DIAGNOSIS — M25662 Stiffness of left knee, not elsewhere classified: Secondary | ICD-10-CM | POA: Diagnosis not present

## 2017-01-08 DIAGNOSIS — M25662 Stiffness of left knee, not elsewhere classified: Secondary | ICD-10-CM | POA: Diagnosis not present

## 2017-01-20 DIAGNOSIS — G4733 Obstructive sleep apnea (adult) (pediatric): Secondary | ICD-10-CM | POA: Diagnosis not present

## 2017-02-06 ENCOUNTER — Ambulatory Visit: Payer: BLUE CROSS/BLUE SHIELD | Admitting: Adult Health

## 2017-02-20 DIAGNOSIS — G4733 Obstructive sleep apnea (adult) (pediatric): Secondary | ICD-10-CM | POA: Diagnosis not present

## 2017-03-05 DIAGNOSIS — N486 Induration penis plastica: Secondary | ICD-10-CM | POA: Diagnosis not present

## 2017-03-27 DIAGNOSIS — N486 Induration penis plastica: Secondary | ICD-10-CM | POA: Diagnosis not present

## 2017-03-29 DIAGNOSIS — N486 Induration penis plastica: Secondary | ICD-10-CM | POA: Diagnosis not present

## 2017-04-05 DIAGNOSIS — Z471 Aftercare following joint replacement surgery: Secondary | ICD-10-CM | POA: Diagnosis not present

## 2017-04-05 DIAGNOSIS — M25669 Stiffness of unspecified knee, not elsewhere classified: Secondary | ICD-10-CM | POA: Diagnosis not present

## 2017-04-05 DIAGNOSIS — T8489XD Other specified complication of internal orthopedic prosthetic devices, implants and grafts, subsequent encounter: Secondary | ICD-10-CM | POA: Diagnosis not present

## 2017-04-05 DIAGNOSIS — Z96652 Presence of left artificial knee joint: Secondary | ICD-10-CM | POA: Diagnosis not present

## 2017-04-25 DIAGNOSIS — N486 Induration penis plastica: Secondary | ICD-10-CM | POA: Diagnosis not present

## 2017-05-08 DIAGNOSIS — N486 Induration penis plastica: Secondary | ICD-10-CM | POA: Diagnosis not present

## 2017-05-10 DIAGNOSIS — N486 Induration penis plastica: Secondary | ICD-10-CM | POA: Diagnosis not present

## 2017-08-23 ENCOUNTER — Other Ambulatory Visit: Payer: Self-pay | Admitting: Family Medicine

## 2017-10-08 ENCOUNTER — Other Ambulatory Visit: Payer: Self-pay | Admitting: Family Medicine

## 2017-10-19 DIAGNOSIS — Z471 Aftercare following joint replacement surgery: Secondary | ICD-10-CM | POA: Diagnosis not present

## 2017-10-19 DIAGNOSIS — Z96652 Presence of left artificial knee joint: Secondary | ICD-10-CM | POA: Diagnosis not present

## 2017-10-19 DIAGNOSIS — M1712 Unilateral primary osteoarthritis, left knee: Secondary | ICD-10-CM | POA: Diagnosis not present

## 2017-12-10 DIAGNOSIS — D225 Melanocytic nevi of trunk: Secondary | ICD-10-CM | POA: Diagnosis not present

## 2017-12-10 DIAGNOSIS — L821 Other seborrheic keratosis: Secondary | ICD-10-CM | POA: Diagnosis not present

## 2017-12-10 DIAGNOSIS — L218 Other seborrheic dermatitis: Secondary | ICD-10-CM | POA: Diagnosis not present

## 2017-12-10 DIAGNOSIS — L820 Inflamed seborrheic keratosis: Secondary | ICD-10-CM | POA: Diagnosis not present

## 2017-12-10 DIAGNOSIS — D1801 Hemangioma of skin and subcutaneous tissue: Secondary | ICD-10-CM | POA: Diagnosis not present

## 2018-01-17 ENCOUNTER — Other Ambulatory Visit: Payer: Self-pay | Admitting: Family Medicine

## 2018-01-18 ENCOUNTER — Other Ambulatory Visit: Payer: Self-pay | Admitting: *Deleted

## 2018-01-18 MED ORDER — TRAZODONE HCL 50 MG PO TABS: ORAL_TABLET | ORAL | 0 refills | Status: DC

## 2018-01-18 NOTE — Telephone Encounter (Signed)
Pt needs an appointment for more refills

## 2018-06-13 DIAGNOSIS — Z96652 Presence of left artificial knee joint: Secondary | ICD-10-CM | POA: Diagnosis not present

## 2018-06-18 ENCOUNTER — Other Ambulatory Visit: Payer: Self-pay | Admitting: Orthopedic Surgery

## 2018-06-18 ENCOUNTER — Other Ambulatory Visit (HOSPITAL_COMMUNITY): Payer: Self-pay | Admitting: Orthopedic Surgery

## 2018-06-18 DIAGNOSIS — Z96652 Presence of left artificial knee joint: Secondary | ICD-10-CM

## 2018-06-28 ENCOUNTER — Encounter (HOSPITAL_COMMUNITY): Payer: BLUE CROSS/BLUE SHIELD

## 2018-06-28 ENCOUNTER — Other Ambulatory Visit (HOSPITAL_COMMUNITY): Payer: BLUE CROSS/BLUE SHIELD

## 2018-07-02 ENCOUNTER — Encounter (HOSPITAL_COMMUNITY)
Admission: RE | Admit: 2018-07-02 | Discharge: 2018-07-02 | Disposition: A | Discharge status: Home / Self Care | Payer: Medicare HMO | Source: Ambulatory Visit | Attending: Orthopedic Surgery | Admitting: Orthopedic Surgery

## 2018-07-02 DIAGNOSIS — Z471 Aftercare following joint replacement surgery: Secondary | ICD-10-CM | POA: Diagnosis not present

## 2018-07-02 DIAGNOSIS — Z96652 Presence of left artificial knee joint: Secondary | ICD-10-CM | POA: Insufficient documentation

## 2018-07-02 MED ORDER — TECHNETIUM TC 99M MEDRONATE IV KIT
21.8000 | PACK | Freq: Once | INTRAVENOUS | Status: AC | PRN
  Administered 2018-07-02: 21.8 via INTRAVENOUS

## 2018-07-09 DIAGNOSIS — H524 Presbyopia: Secondary | ICD-10-CM | POA: Diagnosis not present

## 2018-07-09 DIAGNOSIS — H5203 Hypermetropia, bilateral: Secondary | ICD-10-CM | POA: Diagnosis not present

## 2018-07-09 DIAGNOSIS — H52223 Regular astigmatism, bilateral: Secondary | ICD-10-CM | POA: Diagnosis not present

## 2018-07-11 ENCOUNTER — Encounter (HOSPITAL_COMMUNITY): Payer: Self-pay | Admitting: *Deleted

## 2018-07-15 ENCOUNTER — Ambulatory Visit (INDEPENDENT_AMBULATORY_CARE_PROVIDER_SITE_OTHER): Payer: Medicare HMO | Admitting: Family Medicine

## 2018-07-15 ENCOUNTER — Encounter: Payer: BLUE CROSS/BLUE SHIELD | Admitting: Family Medicine

## 2018-07-15 ENCOUNTER — Encounter: Payer: Self-pay | Admitting: Family Medicine

## 2018-07-15 VITALS — BP 128/72 | HR 75 | Temp 98.9°F | Ht 69.0 in | Wt 192.0 lb

## 2018-07-15 DIAGNOSIS — Z125 Encounter for screening for malignant neoplasm of prostate: Secondary | ICD-10-CM

## 2018-07-15 DIAGNOSIS — Z23 Encounter for immunization: Secondary | ICD-10-CM

## 2018-07-15 DIAGNOSIS — G4700 Insomnia, unspecified: Secondary | ICD-10-CM | POA: Diagnosis not present

## 2018-07-15 DIAGNOSIS — Z131 Encounter for screening for diabetes mellitus: Secondary | ICD-10-CM | POA: Diagnosis not present

## 2018-07-15 DIAGNOSIS — Z0001 Encounter for general adult medical examination with abnormal findings: Secondary | ICD-10-CM

## 2018-07-15 DIAGNOSIS — Z01818 Encounter for other preprocedural examination: Secondary | ICD-10-CM | POA: Diagnosis not present

## 2018-07-15 DIAGNOSIS — Z13220 Encounter for screening for lipoid disorders: Secondary | ICD-10-CM

## 2018-07-15 DIAGNOSIS — Z6828 Body mass index (BMI) 28.0-28.9, adult: Secondary | ICD-10-CM | POA: Diagnosis not present

## 2018-07-15 DIAGNOSIS — I10 Essential (primary) hypertension: Secondary | ICD-10-CM | POA: Diagnosis not present

## 2018-07-15 LAB — POCT URINALYSIS DIPSTICK
Bilirubin, UA: NEGATIVE
Blood, UA: NEGATIVE
Glucose, UA: NEGATIVE
Ketones, UA: NEGATIVE
Leukocytes, UA: NEGATIVE
NITRITE UA: NEGATIVE
PH UA: 6 (ref 5.0–8.0)
PROTEIN UA: NEGATIVE
Spec Grav, UA: >=1.03 — AB (ref 1.010–1.025)
Urobilinogen, UA: 0.2 E.U./dL

## 2018-07-15 LAB — LIPID PANEL
CHOLESTEROL: 170 mg/dL (ref 0–200)
HDL: 38.7 mg/dL — ABNORMAL LOW (ref >39.00)
LDL Cholesterol: 110 mg/dL — ABNORMAL HIGH (ref 0–99)
NonHDL: 131.33
TRIGLYCERIDES: 108 mg/dL (ref 0.0–149.0)
Total CHOL/HDL Ratio: 4
VLDL: 21.6 mg/dL (ref 0.0–40.0)

## 2018-07-15 LAB — COMPREHENSIVE METABOLIC PANEL
ALT: 19 U/L (ref 0–53)
AST: 16 U/L (ref 0–37)
Albumin: 4.4 g/dL (ref 3.5–5.2)
Alkaline Phosphatase: 85 U/L (ref 39–117)
BUN: 15 mg/dL (ref 6–23)
CALCIUM: 9.3 mg/dL (ref 8.4–10.5)
CO2: 31 mEq/L (ref 19–32)
Chloride: 106 mEq/L (ref 96–112)
Creatinine, Ser: 0.95 mg/dL (ref 0.40–1.50)
GFR: 79.56 mL/min (ref >60.00)
Glucose, Bld: 81 mg/dL (ref 70–99)
Potassium: 4.6 mEq/L (ref 3.5–5.1)
Sodium: 144 mEq/L (ref 135–145)
Total Bilirubin: 0.5 mg/dL (ref 0.2–1.2)
Total Protein: 6.7 g/dL (ref 6.0–8.3)

## 2018-07-15 LAB — CBC
HEMATOCRIT: 42.2 % (ref 39.0–52.0)
HEMOGLOBIN: 14.1 g/dL (ref 13.0–17.0)
MCHC: 33.3 g/dL (ref 30.0–36.0)
MCV: 90.2 fl (ref 78.0–100.0)
Platelets: 253 10*3/uL (ref 150.0–400.0)
RBC: 4.68 Mil/uL (ref 4.22–5.81)
RDW: 13.4 % (ref 11.5–15.5)
WBC: 3.4 10*3/uL — ABNORMAL LOW (ref 4.0–10.5)

## 2018-07-15 LAB — HEMOGLOBIN A1C: Hgb A1c MFr Bld: 5.3 % (ref 4.6–6.5)

## 2018-07-15 NOTE — Assessment & Plan Note (Signed)
Stable.  Continue trazodone as needed.  Recommended increasing to 100 mg if needed.  Follow-up in 1 year.

## 2018-07-15 NOTE — Assessment & Plan Note (Signed)
At goal.  Continue lisinopril 40 mg daily.  Check CBC and C met.

## 2018-07-15 NOTE — Patient Instructions (Addendum)
It was very nice to see you today!  We will check blood work.  Depending on results of your blood work, you will be cleared for surgery.  No other medication changes today.  Please come back to see me in 1 year for your next physical, or sooner as needed.   Eat at least 3 REAL meals and 1-2 snacks per day.  Aim for no more than 5 hours between eating.  Eat breakfast within one hour of getting up.    Obtain twice as many fruits/vegetables as protein or carbohydrate foods for both lunch and dinner.   Cut down on sweet beverages. This includes juice, soda, and sweet tea.    Exercise at least 150 minutes every week.   Take care, Dr Jerline Pain   Preventive Care 20 Years and Older, Male Preventive care refers to lifestyle choices and visits with your health care provider that can promote health and wellness. What does preventive care include?   A yearly physical exam. This is also called an annual well check.  Dental exams once or twice a year.  Routine eye exams. Ask your health care provider how often you should have your eyes checked.  Personal lifestyle choices, including: ? Daily care of your teeth and gums. ? Regular physical activity. ? Eating a healthy diet. ? Avoiding tobacco and drug use. ? Limiting alcohol use. ? Practicing safe sex. ? Taking low doses of aspirin every day. ? Taking vitamin and mineral supplements as recommended by your health care provider. What happens during an annual well check? The services and screenings done by your health care provider during your annual well check will depend on your age, overall health, lifestyle risk factors, and family history of disease. Counseling Your health care provider may ask you questions about your:  Alcohol use.  Tobacco use.  Drug use.  Emotional well-being.  Home and relationship well-being.  Sexual activity.  Eating habits.  History of falls.  Memory and ability to understand  (cognition).  Work and work Statistician. Screening You may have the following tests or measurements:  Height, weight, and BMI.  Blood pressure.  Lipid and cholesterol levels. These may be checked every 5 years, or more frequently if you are over 57 years old.  Skin check.  Lung cancer screening. You may have this screening every year starting at age 52 if you have a 30-pack-year history of smoking and currently smoke or have quit within the past 15 years.  Colorectal cancer screening. All adults should have this screening starting at age 68 and continuing until age 30. You will have tests every 1-10 years, depending on your results and the type of screening test. People at increased risk should start screening at an earlier age. Screening tests may include: ? Guaiac-based fecal occult blood testing. ? Fecal immunochemical test (FIT). ? Stool DNA test. ? Virtual colonoscopy. ? Sigmoidoscopy. During this test, a flexible tube with a tiny camera (sigmoidoscope) is used to examine your rectum and lower colon. The sigmoidoscope is inserted through your anus into your rectum and lower colon. ? Colonoscopy. During this test, a long, thin, flexible tube with a tiny camera (colonoscope) is used to examine your entire colon and rectum.  Prostate cancer screening. Recommendations will vary depending on your family history and other risks.  Hepatitis C blood test.  Hepatitis B blood test.  Sexually transmitted disease (STD) testing.  Diabetes screening. This is done by checking your blood sugar (glucose) after you have not  eaten for a while (fasting). You may have this done every 1-3 years.  Abdominal aortic aneurysm (AAA) screening. You may need this if you are a current or former smoker.  Osteoporosis. You may be screened starting at age 26 if you are at high risk. Talk with your health care provider about your test results, treatment options, and if necessary, the need for more  tests. Vaccines Your health care provider may recommend certain vaccines, such as:  Influenza vaccine. This is recommended every year.  Tetanus, diphtheria, and acellular pertussis (Tdap, Td) vaccine. You may need a Td booster every 10 years.  Varicella vaccine. You may need this if you have not been vaccinated.  Zoster vaccine. You may need this after age 39.  Measles, mumps, and rubella (MMR) vaccine. You may need at least one dose of MMR if you were born in 1957 or later. You may also need a second dose.  Pneumococcal 13-valent conjugate (PCV13) vaccine. One dose is recommended after age 65.  Pneumococcal polysaccharide (PPSV23) vaccine. One dose is recommended after age 13.  Meningococcal vaccine. You may need this if you have certain conditions.  Hepatitis A vaccine. You may need this if you have certain conditions or if you travel or work in places where you may be exposed to hepatitis A.  Hepatitis B vaccine. You may need this if you have certain conditions or if you travel or work in places where you may be exposed to hepatitis B.  Haemophilus influenzae type b (Hib) vaccine. You may need this if you have certain risk factors. Talk to your health care provider about which screenings and vaccines you need and how often you need them. This information is not intended to replace advice given to you by your health care provider. Make sure you discuss any questions you have with your health care provider. Document Released: 06/11/2015 Document Revised: 07/05/2017 Document Reviewed: 03/16/2015 Elsevier Interactive Patient Education  2019 [employer].

## 2018-07-15 NOTE — Progress Notes (Signed)
Subjective:  John Bowen is a 63 y.o. male who presents today for his annual comprehensive physical exam.    HPI:  He has no acute complaints today.   He is currently scheduled to undergo the left knee revision on 07/31/2018 by Dr. Wynelle Link.  Patient request surgical clearance today as well.  He has had some intermittent rhinorrhea and sinus congestion over the last few days that seems to be improving with Zicam.  # Essential Hypertension - On lisinopril 7m daily and tolerating well - ROS: No reported chest Bowen or shortness of breath  # Insomnia - On trazodone 25-578mnightly as needed and tolerating well  % OSA  - Not on CPAP  Lifestyle Diet: No specific diets.  Exercise: Limited due to knee Bowen and back Bowen.   Depression screen PHQ 2/9 07/15/2018  Decreased Interest 0  Down, Depressed, Hopeless 0  PHQ - 2 Score 0    Health Maintenance Due  Topic Date Due  . Hepatitis C Screening  0201/06/1955. HIV Screening  06/30/1968  . COLONOSCOPY  06/09/2015  . INFLUENZA VACCINE  12/27/2017  . PNA vac Low Risk Adult (1 of 2 - PCV13) 06/30/2018    ROS: Per HPI, otherwise a complete review of systems was negative.   PMH:  The following were reviewed and entered/updated in epic: Past Medical History:  Diagnosis Date  . DJD (degenerative joint disease)    knees  . Dysfunction of sleep stage or arousal   . Hypertension   . Meningeal disorder   . Meningitis    Zoster  . Shingles    Patient Active Problem List   Diagnosis Date Noted  . OSA (obstructive sleep apnea) 09/06/2016  . Peyronie disease 06/13/2016  . Osteoarthritis of left knee 04/20/2014  . Essential hypertension 05/25/2008  . Insomnia 04/02/2007   History reviewed. No pertinent surgical history.  Family History  Problem Relation Age of Onset  . Hypertension Other     Medications- reviewed and updated Current Outpatient Medications  Medication Sig Dispense Refill  . lisinopril (PRINIVIL,ZESTRIL)  40 MG tablet TAKE 1 TABLET BY MOUTH ONCE DAILY 90 tablet 4  . traZODone (DESYREL) 50 MG tablet TAKE 1/2 TO 1 (ONE-HALF TO ONE) TABLET BY MOUTH AT BEDTIME AS NEEDED FOR SLEEP 90 tablet 0   No current facility-administered medications for this visit.     Allergies-reviewed and updated No Known Allergies  Social History   Socioeconomic History  . Marital status: Married    Spouse name: Not on file  . Number of children: Not on file  . Years of education: Not on file  . Highest education level: Not on file  Occupational History  . Not on file  Social Needs  . Financial resource strain: Not on file  . Food insecurity:    Worry: Not on file    Inability: Not on file  . Transportation needs:    Medical: Not on file    Non-medical: Not on file  Tobacco Use  . Smoking status: Never Smoker  . Smokeless tobacco: Never Used  Substance and Sexual Activity  . Alcohol use: Yes    Alcohol/week: 3.0 standard drinks    Types: 3 Cans of beer per week  . Drug use: No  . Sexual activity: Not on file  Lifestyle  . Physical activity:    Days per week: Not on file    Minutes per session: Not on file  . Stress: Not on file  Relationships  .  Social connections:    Talks on phone: Not on file    Gets together: Not on file    Attends religious service: Not on file    Active member of club or organization: Not on file    Attends meetings of clubs or organizations: Not on file    Relationship status: Not on file  Other Topics Concern  . Not on file  Social History Narrative  . Not on file    Objective:  Physical Exam: BP 128/72 (BP Location: Left Arm, Patient Position: Sitting, Cuff Size: Normal)   Pulse 75   Temp 98.9 F (37.2 C) (Oral)   Ht '5\' 9"'  (1.753 m)   Wt 192 lb (87.1 kg)   SpO2 96%   BMI 28.35 kg/m   Body mass index is 28.35 kg/m. Wt Readings from Last 3 Encounters:  07/15/18 192 lb (87.1 kg)  11/30/16 186 lb 8 oz (84.6 kg)  09/06/16 200 lb (90.7 kg)   Gen: NAD,  resting comfortably HEENT: TMs normal bilaterally. OP clear. No thyromegaly noted.  CV: RRR with no murmurs appreciated Pulm: NWOB, CTAB with no crackles, wheezes, or rhonchi GI: Normal bowel sounds present. Soft, Nontender, Nondistended. MSK: no edema, cyanosis, or clubbing noted Skin: warm, dry Neuro: CN2-12 grossly intact. Strength 5/5 in upper and lower extremities. Reflexes symmetric and intact bilaterally.  Psych: Normal affect and thought content  EKG: Normal sinus rhythm.  No acute ischemic changes.  Assessment/Plan:  Insomnia Stable.  Continue trazodone as needed.  Recommended increasing to 100 mg if needed.  Follow-up in 1 year.  Essential hypertension At goal.  Continue lisinopril 40 mg daily.  Check CBC and C met.  Encounter For Surgical Clearance Will check CBC, C met, A1c, and urinalysis per request of his surgeon.  Normal physical exam otherwise.  EKG without ischemic changes. Depending on results of blood work, will clear for surgery.  Preventative Healthcare: Flu shot given today.  Deferred colonoscopy and pneumonia vaccine for today. Check lipid panel and PSA.   Patient Counseling(The following topics were reviewed and/or handout was given):  -Nutrition: Stressed importance of moderation in sodium/caffeine intake, saturated fat and cholesterol, caloric balance, sufficient intake of fresh fruits, vegetables, and fiber.  -Stressed the importance of regular exercise.   -Substance Abuse: Discussed cessation/primary prevention of tobacco, alcohol, or other drug use; driving or other dangerous activities under the influence; availability of treatment for abuse.   -Injury prevention: Discussed safety belts, safety helmets, smoke detector, smoking near bedding or upholstery.   -Sexuality: Discussed sexually transmitted diseases, partner selection, use of condoms, avoidance of unintended pregnancy and contraceptive alternatives.   -Dental health: Discussed importance of  regular tooth brushing, flossing, and dental visits.  -Health maintenance and immunizations reviewed. Please refer to Health maintenance section.  Return to care in 1 year for next preventative visit.   John Bowen. John Pain, MD 07/15/2018 2:17 PM

## 2018-07-16 LAB — TSH: TSH: 0.74 u[IU]/mL (ref 0.35–4.50)

## 2018-07-16 LAB — PSA, MEDICARE: PSA: 1.05 ng/ml (ref 0.10–4.00)

## 2018-07-17 ENCOUNTER — Encounter: Payer: Self-pay | Admitting: Family Medicine

## 2018-07-17 ENCOUNTER — Other Ambulatory Visit: Payer: Self-pay

## 2018-07-17 DIAGNOSIS — E785 Hyperlipidemia, unspecified: Secondary | ICD-10-CM | POA: Insufficient documentation

## 2018-07-17 MED ORDER — ATORVASTATIN CALCIUM 40 MG PO TABS: 40.0000 mg | ORAL_TABLET | Freq: Every day | ORAL | 3 refills | Status: DC

## 2018-07-17 NOTE — H&P (Signed)
TOTAL KNEE ADMISSION H&P  Patient is being admitted for left knee polyethylene versus TKA revision  Subjective:  Chief Complaint: Unstable left TKA  HPI: Tyler Cubit is a 63 year old male who presents for pre-operative visit in preparation for their left total knee polyethylene exchange vs TKA revision, which is scheduled on 07/31/2018 with Dr. Wynelle Link at [facility]. The patient has had symptoms in the left knee including pain, instability, swelling which has impacted their quality of life and ability to do activities of daily living. The patient currently has a diagnosis of unstable left total knee and has failed conservative treatments including activity modification, strengthening activities. The patient has had previous total arthroplasty, arthroscopy with meniscal debridement on the left knee. The patient denies an active infection.  Patient Active Problem List   Diagnosis Date Noted  . Dyslipidemia 07/17/2018  . OSA (obstructive sleep apnea) 09/06/2016  . Peyronie disease 06/13/2016  . Osteoarthritis of left knee 04/20/2014  . Essential hypertension 05/25/2008  . Insomnia 04/02/2007   Past Medical History:  Diagnosis Date  . DJD (degenerative joint disease)    knees  . Dysfunction of sleep stage or arousal   . Hypertension   . Meningeal disorder   . Meningitis    Zoster  . Shingles     No past surgical history on file.  No current facility-administered medications for this encounter.    Current Outpatient Medications  Medication Sig Dispense Refill Last Dose  . hydrocortisone 2.5 % cream Apply 1 application topically daily as needed (itching).     Marland Kitchen lisinopril (PRINIVIL,ZESTRIL) 40 MG tablet TAKE 1 TABLET BY MOUTH ONCE DAILY (Patient taking differently: Take 40 mg by mouth daily. ) 90 tablet 4 Taking  . traZODone (DESYREL) 50 MG tablet TAKE 1/2 TO 1 (ONE-HALF TO ONE) TABLET BY MOUTH AT BEDTIME AS NEEDED FOR SLEEP (Patient taking differently: Take 50 mg by mouth at  bedtime. ) 90 tablet 0 Taking  . atorvastatin (LIPITOR) 40 MG tablet Take 1 tablet (40 mg total) by mouth daily. 90 tablet 3    No Known Allergies  Social History   Tobacco Use  . Smoking status: Never Smoker  . Smokeless tobacco: Never Used  Substance Use Topics  . Alcohol use: Yes    Alcohol/week: 3.0 standard drinks    Types: 3 Cans of beer per week    Family History  Problem Relation Age of Onset  . Hypertension Other      Review of Systems  Constitutional: Negative for chills and fever.  HENT: Negative for congestion, sore throat and tinnitus.   Eyes: Negative for double vision, photophobia and pain.  Respiratory: Negative for cough, shortness of breath and wheezing.   Cardiovascular: Negative for chest pain, palpitations and orthopnea.  Gastrointestinal: Negative for heartburn, nausea and vomiting.  Genitourinary: Negative for dysuria, frequency and urgency.  Musculoskeletal: Positive for joint pain.  Neurological: Negative for dizziness, weakness and headaches.    Objective:  Physical Exam  Well nourished and well developed. General: Alert and oriented x3, cooperative and pleasant, no acute distress. Head:normocephalic, atraumatic, neck supple. Eyes: EOMI. Respiratory: breath sounds clear in all fields, no wheezing, rales, or rhonchi. Cardiovascular: Regular rate and rhythm, no murmurs, gallops or rubs.  Abdomen: non-tender to palpation and soft, normoactive bowel sounds.  Musculoskeletal: Left knee exam: Mild effusion. Well healed arthroplasty scar.  No warmth.  Minimal A/P laxity in flexion and varus/valgus laxity in flexion.  Calves soft and nontender. Motor function intact  in LE. Strength 5/5 LE bilaterally. Neuro: Distal pulses 2+. Sensation to light touch intact in LE.  Vital signs in last 24 hours:  Blood pressure: 134/78 mmHg Pulse: 60 bpm  Labs:   Estimated body mass index is 28.35 kg/m as calculated from the following:   Height as of  07/15/18: 5\' 9"  (1.753 m).   Weight as of 07/15/18: 87.1 kg.   Imaging Review X-rays of the left knee shows prosthesis in excellent position with no periprosthetic abnormalities.  Nuclear bone scan of the left knee showed increased uptake around the tibial prosthesis, unable to rule out definitive loosening.  Assessment/Plan:  Unstable left TKA  The patient history, physical examination, clinical judgment of the provider and imaging studies are consistent with unstable left TKA and left knee polyethylene versus TKA revision is deemed medically necessary. The treatment options including medical management, injection therapy arthroscopy and arthroplasty were discussed at length. The risks and benefits of total knee arthroplasty were presented and reviewed. The risks due to aseptic loosening, infection, stiffness, patella tracking problems, thromboembolic complications and other imponderables were discussed. The patient acknowledged the explanation, agreed to proceed with the plan and consent was signed. Patient is being admitted for inpatient treatment for surgery, pain control, PT, OT, prophylactic antibiotics, VTE prophylaxis, progressive ambulation and ADL's and discharge planning. The patient is planning to be discharged home.  Therapy Plans: outpatient therapy at [facility] Disposition: Home with wife Planned DVT Prophylaxis: aspirin 325mg  BID DME needed: none PCP: Dr. Jerline Pain (PCP) TXA: IV Allergies: NKDA Anesthesia Concerns: none BMI: 25.1  - Patient was instructed on what medications to stop prior to surgery. - Follow-up visit in 2 weeks with Dr. Wynelle Link - Begin physical therapy following surgery - Pre-operative lab work as pre-surgical testing - Prescriptions will be provided in hospital at time of discharge  Theresa Duty, PA-C Orthopedic Surgery [facility] [place]

## 2018-07-17 NOTE — Progress Notes (Signed)
Please inform patient of the following:  His cholesterol was up just a little, but all of his other blood and urine tests are NORMAL. He is clear to have surgery - will fax form to his surgeon.  He could potentially benefit from starting lipitor 40mg  daily to help improve cholesterol numbers and lower risk of heart attack and stroke. Please send in if pt is will to start. Regardless, he should continue working on diet and exercise and we can recheck in a year.  John Bowen. Jerline Pain, MD 07/17/2018 8:04 AM

## 2018-07-23 NOTE — Patient Instructions (Signed)
John Bowen  07/23/2018   Your procedure is scheduled on: 07-31-18   Report to [facility] Main  Entrance               Report to admitting at        0240 PM    Call this number if you have problems the morning of surgery [phone]    Remember: Do not eat food:After Midnight. YOU MAY HAVE CLEAR LIQUIDS UNTIL 1100 AM THEN NOTHING BY MOUTH    CLEAR LIQUID DIET   Foods Allowed                                                                     Foods Excluded  Coffee and tea, regular and decaf                             liquids that you cannot  Plain Jell-O in any flavor                                             see through such as: Fruit ices (not with fruit pulp)                                     milk, soups, orange juice  Iced Popsicles                                    All solid food Carbonated beverages, regular and diet                                    Cranberry, grape and apple juices Sports drinks like Gatorade Lightly seasoned clear broth or consume(fat free) Sugar, honey syrup   _____________________________________________________________________    BRUSH YOUR TEETH MORNING OF SURGERY AND RINSE YOUR MOUTH OUT, NO CHEWING GUM CANDY OR MINTS.     Take these medicines the morning of surgery with A SIP OF WATER:                                 You may not have any metal on your body including hair pins and              piercings  Do not wear jewelry,  lotions, powders or perfumes, deodorant                         Men may shave face and neck.   Do not bring valuables to the hospital. [name].  Contacts, dentures or bridgework may not be worn into surgery.  Leave  suitcase in the car. After surgery it may be brought to your room.                 Please read over the following fact sheets you were given: _____________________________________________________________________            [facility] - Preparing for Surgery Before surgery, you can play an important role.  Because skin is not sterile, your skin needs to be as free of germs as possible.  You can reduce the number of germs on your skin by washing with CHG (chlorahexidine gluconate) soap before surgery.  CHG is an antiseptic cleaner which kills germs and bonds with the skin to continue killing germs even after washing. Please DO NOT use if you have an allergy to CHG or antibacterial soaps.  If your skin becomes reddened/irritated stop using the CHG and inform your nurse when you arrive at Short Stay. Do not shave (including legs and underarms) for at least 48 hours prior to the first CHG shower.  You may shave your face/neck. Please follow these instructions carefully:  1.  Shower with CHG Soap the night before surgery and the  morning of Surgery.  2.  If you choose to wash your hair, wash your hair first as usual with your  normal  shampoo.  3.  After you shampoo, rinse your hair and body thoroughly to remove the  shampoo.                           4.  Use CHG as you would any other liquid soap.  You can apply chg directly  to the skin and wash                       Gently with a scrungie or clean washcloth.  5.  Apply the CHG Soap to your body ONLY FROM THE NECK DOWN.   Do not use on face/ open                           Wound or open sores. Avoid contact with eyes, ears mouth and genitals (private parts).                       Wash face,  Genitals (private parts) with your normal soap.             6.  Wash thoroughly, paying special attention to the area where your surgery  will be performed.  7.  Thoroughly rinse your body with warm water from the neck down.  8.  DO NOT shower/wash with your normal soap after using and rinsing off  the CHG Soap.                9.  Pat yourself dry with a clean towel.            10.  Wear clean pajamas.            11.  Place clean sheets on your bed the night of your first  shower and do not  sleep with pets. Day of Surgery : Do not apply any lotions/deodorants the morning of surgery.  Please wear clean clothes to the hospital/surgery center.  FAILURE TO FOLLOW THESE INSTRUCTIONS MAY RESULT IN THE CANCELLATION OF YOUR SURGERY PATIENT SIGNATURE_________________________________  NURSE SIGNATURE__________________________________  ________________________________________________________________________  WHAT IS A BLOOD TRANSFUSION? Blood  Transfusion Information  A transfusion is the replacement of blood or some of its parts. Blood is made up of multiple cells which provide different functions.  Red blood cells carry oxygen and are used for blood loss replacement.  White blood cells fight against infection.  Platelets control bleeding.  Plasma helps clot blood.  Other blood products are available for specialized needs, such as hemophilia or other clotting disorders. BEFORE THE TRANSFUSION  Who gives blood for transfusions?   Healthy volunteers who are fully evaluated to make sure their blood is safe. This is blood bank blood. Transfusion therapy is the safest it has ever been in the practice of medicine. Before blood is taken from a donor, a complete history is taken to make sure that person has no history of diseases nor engages in risky social behavior (examples are intravenous drug use or sexual activity with multiple partners). The donor's travel history is screened to minimize risk of transmitting infections, such as malaria. The donated blood is tested for signs of infectious diseases, such as HIV and hepatitis. The blood is then tested to be sure it is compatible with you in order to minimize the chance of a transfusion reaction. If you or a relative donates blood, this is often done in anticipation of surgery and is not appropriate for emergency situations. It takes many days to process the donated blood. RISKS AND COMPLICATIONS Although transfusion  therapy is very safe and saves many lives, the main dangers of transfusion include:   Getting an infectious disease.  Developing a transfusion reaction. This is an allergic reaction to something in the blood you were given. Every precaution is taken to prevent this. The decision to have a blood transfusion has been considered carefully by your caregiver before blood is given. Blood is not given unless the benefits outweigh the risks. AFTER THE TRANSFUSION  Right after receiving a blood transfusion, you will usually feel much better and more energetic. This is especially true if your red blood cells have gotten low (anemic). The transfusion raises the level of the red blood cells which carry oxygen, and this usually causes an energy increase.  The nurse administering the transfusion will monitor you carefully for complications. HOME CARE INSTRUCTIONS  No special instructions are needed after a transfusion. You may find your energy is better. Speak with your caregiver about any limitations on activity for underlying diseases you may have. SEEK MEDICAL CARE IF:   Your condition is not improving after your transfusion.  You develop redness or irritation at the intravenous (IV) site. SEEK IMMEDIATE MEDICAL CARE IF:  Any of the following symptoms occur over the next 12 hours:  Shaking chills.  You have a temperature by mouth above 102 F (38.9 C), not controlled by medicine.  Chest, back, or muscle pain.  People around you feel you are not acting correctly or are confused.  Shortness of breath or difficulty breathing.  Dizziness and fainting.  You get a rash or develop hives.  You have a decrease in urine output.  Your urine turns a dark color or changes to pink, red, or brown. Any of the following symptoms occur over the next 10 days:  You have a temperature by mouth above 102 F (38.9 C), not controlled by medicine.  Shortness of breath.  Weakness after normal  activity.  The white part of the eye turns yellow (jaundice).  You have a decrease in the amount of urine or are urinating less often.  Your urine  turns a dark color or changes to pink, red, or brown. Document Released: 05/12/2000 Document Revised: 08/07/2011 Document Reviewed: 12/30/2007 ExitCare Patient Information 2014 [place].  _______________________________________________________________________  Incentive Spirometer  An incentive spirometer is a tool that can help keep your lungs clear and active. This tool measures how well you are filling your lungs with each breath. Taking long deep breaths may help reverse or decrease the chance of developing breathing (pulmonary) problems (especially infection) following:  A long period of time when you are unable to move or be active. BEFORE THE PROCEDURE   If the spirometer includes an indicator to show your best effort, your nurse or respiratory therapist will set it to a desired goal.  If possible, sit up straight or lean slightly forward. Try not to slouch.  Hold the incentive spirometer in an upright position. INSTRUCTIONS FOR USE  1. Sit on the edge of your bed if possible, or sit up as far as you can in bed or on a chair. 2. Hold the incentive spirometer in an upright position. 3. Breathe out normally. 4. Place the mouthpiece in your mouth and seal your lips tightly around it. 5. Breathe in slowly and as deeply as possible, raising the piston or the ball toward the top of the column. 6. Hold your breath for 3-5 seconds or for as long as possible. Allow the piston or ball to fall to the bottom of the column. 7. Remove the mouthpiece from your mouth and breathe out normally. 8. Rest for a few seconds and repeat Steps 1 through 7 at least 10 times every 1-2 hours when you are awake. Take your time and take a few normal breaths between deep breaths. 9. The spirometer may include an indicator to show your best effort. Use the  indicator as a goal to work toward during each repetition. 10. After each set of 10 deep breaths, practice coughing to be sure your lungs are clear. If you have an incision (the cut made at the time of surgery), support your incision when coughing by placing a pillow or rolled up towels firmly against it. Once you are able to get out of bed, walk around indoors and cough well. You may stop using the incentive spirometer when instructed by your caregiver.  RISKS AND COMPLICATIONS  Take your time so you do not get dizzy or light-headed.  If you are in pain, you may need to take or ask for pain medication before doing incentive spirometry. It is harder to take a deep breath if you are having pain. AFTER USE  Rest and breathe slowly and easily.  It can be helpful to keep track of a log of your progress. Your caregiver can provide you with a simple table to help with this. If you are using the spirometer at home, follow these instructions: [place] IF:   You are having difficultly using the spirometer.  You have trouble using the spirometer as often as instructed.  Your pain medication is not giving enough relief while using the spirometer.  You develop fever of 100.5 F (38.1 C) or higher. SEEK IMMEDIATE MEDICAL CARE IF:   You cough up bloody sputum that had not been present before.  You develop fever of 102 F (38.9 C) or greater.  You develop worsening pain at or near the incision site. MAKE SURE YOU:   Understand these instructions.  Will watch your condition.  Will get help right away if you are not doing well  or get worse. Document Released: 09/25/2006 Document Revised: 08/07/2011 Document Reviewed: 11/26/2006 [facility] Patient Information 2014 [place], [place].   ________________________________________________________________________

## 2018-07-23 NOTE — Progress Notes (Signed)
Clearance dr. Dimas Chyle 07-15-18 on chart  lov  Note 07-15-18 on chart  ekg 07-15-18 on chart   labs 07-15-18 on chart

## 2018-07-24 ENCOUNTER — Encounter (HOSPITAL_COMMUNITY): Payer: Self-pay

## 2018-07-24 ENCOUNTER — Other Ambulatory Visit: Payer: Self-pay

## 2018-07-24 ENCOUNTER — Encounter (HOSPITAL_COMMUNITY)
Admission: RE | Admit: 2018-07-24 | Discharge: 2018-07-24 | Disposition: A | Discharge status: Home / Self Care | Payer: Medicare HMO | Source: Ambulatory Visit | Attending: Orthopedic Surgery | Admitting: Orthopedic Surgery

## 2018-07-24 DIAGNOSIS — Z01812 Encounter for preprocedural laboratory examination: Secondary | ICD-10-CM | POA: Insufficient documentation

## 2018-07-24 DIAGNOSIS — T84023A Instability of internal left knee prosthesis, initial encounter: Secondary | ICD-10-CM | POA: Diagnosis not present

## 2018-07-24 HISTORY — DX: Other complications of anesthesia, initial encounter: T88.59XA

## 2018-07-24 HISTORY — DX: Adverse effect of unspecified anesthetic, initial encounter: T41.45XA

## 2018-07-24 HISTORY — DX: Other specified postprocedural states: Z98.890

## 2018-07-24 HISTORY — DX: Other specified postprocedural states: R11.2

## 2018-07-24 HISTORY — DX: Sleep apnea, unspecified: G47.30

## 2018-07-24 HISTORY — DX: Unspecified asthma, uncomplicated: J45.909

## 2018-07-24 LAB — CBC
HCT: 42 % (ref 39.0–52.0)
Hemoglobin: 13.8 g/dL (ref 13.0–17.0)
MCH: 30.1 pg (ref 26.0–34.0)
MCHC: 32.9 g/dL (ref 30.0–36.0)
MCV: 91.7 fL (ref 80.0–100.0)
NRBC: 0 % (ref 0.0–0.2)
PLATELETS: 306 10*3/uL (ref 150–400)
RBC: 4.58 MIL/uL (ref 4.22–5.81)
RDW: 12.5 % (ref 11.5–15.5)
WBC: 4.1 10*3/uL (ref 4.0–10.5)

## 2018-07-24 LAB — COMPREHENSIVE METABOLIC PANEL
ALT: 21 U/L (ref 0–44)
AST: 20 U/L (ref 15–41)
Albumin: 4.4 g/dL (ref 3.5–5.0)
Alkaline Phosphatase: 84 U/L (ref 38–126)
Anion gap: 6 (ref 5–15)
BUN: 19 mg/dL (ref 8–23)
CHLORIDE: 110 mmol/L (ref 98–111)
CO2: 26 mmol/L (ref 22–32)
Calcium: 8.7 mg/dL — ABNORMAL LOW (ref 8.9–10.3)
Creatinine, Ser: 0.83 mg/dL (ref 0.61–1.24)
GFR calc Af Amer: >60 mL/min (ref >60)
GFR calc non Af Amer: >60 mL/min (ref >60)
GLUCOSE: 97 mg/dL (ref 70–99)
Potassium: 3.9 mmol/L (ref 3.5–5.1)
Sodium: 142 mmol/L (ref 135–145)
Total Bilirubin: 0.5 mg/dL (ref 0.3–1.2)
Total Protein: 7.2 g/dL (ref 6.5–8.1)

## 2018-07-24 LAB — PROTIME-INR
INR: 1 (ref 0.8–1.2)
Prothrombin Time: 13.2 seconds (ref 11.4–15.2)

## 2018-07-24 LAB — SURGICAL PCR SCREEN
MRSA, PCR: NEGATIVE
Staphylococcus aureus: NEGATIVE

## 2018-07-24 LAB — APTT: aPTT: 28 seconds (ref 24–36)

## 2018-07-24 LAB — ABO/RH: ABO/RH(D): O NEG

## 2018-07-30 MED ORDER — BUPIVACAINE LIPOSOME 1.3 % IJ SUSP
20.0000 mL | Freq: Once | INTRAMUSCULAR | Status: DC
  Filled 2018-07-30: qty 20

## 2018-07-31 ENCOUNTER — Encounter (HOSPITAL_COMMUNITY): Payer: Self-pay | Admitting: General Practice

## 2018-07-31 ENCOUNTER — Encounter (HOSPITAL_COMMUNITY)
Admission: RE | Disposition: A | Discharge status: Home / Self Care | Payer: Self-pay | Source: Home / Self Care | Attending: Orthopedic Surgery

## 2018-07-31 ENCOUNTER — Inpatient Hospital Stay (HOSPITAL_COMMUNITY): Payer: Medicare HMO | Admitting: Physician Assistant

## 2018-07-31 ENCOUNTER — Telehealth (HOSPITAL_COMMUNITY): Payer: Self-pay | Admitting: *Deleted

## 2018-07-31 ENCOUNTER — Inpatient Hospital Stay (HOSPITAL_COMMUNITY)
Admission: RE | Admit: 2018-07-31 | Discharge: 2018-08-01 | DRG: 489 | Disposition: A | Discharge status: Home / Self Care | Payer: Medicare HMO | Attending: Orthopedic Surgery | Admitting: Orthopedic Surgery

## 2018-07-31 ENCOUNTER — Other Ambulatory Visit: Payer: Self-pay

## 2018-07-31 ENCOUNTER — Inpatient Hospital Stay (HOSPITAL_COMMUNITY): Payer: Medicare HMO | Admitting: Certified Registered Nurse Anesthetist

## 2018-07-31 ENCOUNTER — Encounter (INDEPENDENT_AMBULATORY_CARE_PROVIDER_SITE_OTHER): Payer: Self-pay

## 2018-07-31 DIAGNOSIS — T84093A Other mechanical complication of internal left knee prosthesis, initial encounter: Secondary | ICD-10-CM | POA: Diagnosis present

## 2018-07-31 DIAGNOSIS — T859XXA Unspecified complication of internal prosthetic device, implant and graft, initial encounter: Secondary | ICD-10-CM | POA: Diagnosis not present

## 2018-07-31 DIAGNOSIS — I10 Essential (primary) hypertension: Secondary | ICD-10-CM | POA: Diagnosis present

## 2018-07-31 DIAGNOSIS — Z8249 Family history of ischemic heart disease and other diseases of the circulatory system: Secondary | ICD-10-CM | POA: Diagnosis not present

## 2018-07-31 DIAGNOSIS — L905 Scar conditions and fibrosis of skin: Secondary | ICD-10-CM | POA: Diagnosis present

## 2018-07-31 DIAGNOSIS — E785 Hyperlipidemia, unspecified: Secondary | ICD-10-CM | POA: Diagnosis not present

## 2018-07-31 DIAGNOSIS — M25362 Other instability, left knee: Secondary | ICD-10-CM

## 2018-07-31 DIAGNOSIS — G4700 Insomnia, unspecified: Secondary | ICD-10-CM | POA: Diagnosis not present

## 2018-07-31 DIAGNOSIS — T84023A Instability of internal left knee prosthesis, initial encounter: Secondary | ICD-10-CM | POA: Diagnosis not present

## 2018-07-31 DIAGNOSIS — Y792 Prosthetic and other implants, materials and accessory orthopedic devices associated with adverse incidents: Secondary | ICD-10-CM | POA: Diagnosis not present

## 2018-07-31 DIAGNOSIS — Z96659 Presence of unspecified artificial knee joint: Secondary | ICD-10-CM

## 2018-07-31 DIAGNOSIS — Z96652 Presence of left artificial knee joint: Secondary | ICD-10-CM | POA: Diagnosis not present

## 2018-07-31 DIAGNOSIS — G8918 Other acute postprocedural pain: Secondary | ICD-10-CM | POA: Diagnosis not present

## 2018-07-31 DIAGNOSIS — Z79899 Other long term (current) drug therapy: Secondary | ICD-10-CM | POA: Diagnosis not present

## 2018-07-31 DIAGNOSIS — T84018A Broken internal joint prosthesis, other site, initial encounter: Secondary | ICD-10-CM

## 2018-07-31 DIAGNOSIS — G4733 Obstructive sleep apnea (adult) (pediatric): Secondary | ICD-10-CM | POA: Diagnosis not present

## 2018-07-31 HISTORY — PX: TOTAL KNEE REVISION: SHX996

## 2018-07-31 LAB — TYPE AND SCREEN
ABO/RH(D): O NEG
Antibody Screen: NEGATIVE

## 2018-07-31 SURGERY — TOTAL KNEE REVISION
Anesthesia: Spinal | Site: Knee | Laterality: Left

## 2018-07-31 MED ORDER — PHENYLEPHRINE HCL 10 MG/ML IJ SOLN
INTRAVENOUS | Status: DC | PRN
  Administered 2018-07-31: 25 ug/min via INTRAVENOUS

## 2018-07-31 MED ORDER — MEPERIDINE HCL 50 MG/ML IJ SOLN: 6.2500 mg | INTRAMUSCULAR | Status: DC | PRN

## 2018-07-31 MED ORDER — CEFAZOLIN SODIUM-DEXTROSE 2-4 GM/100ML-% IV SOLN
2.0000 g | INTRAVENOUS | Status: AC
  Administered 2018-07-31: 2 g via INTRAVENOUS
  Filled 2018-07-31: qty 100

## 2018-07-31 MED ORDER — MIDAZOLAM HCL 2 MG/2ML IJ SOLN: 0.5000 mg | Freq: Once | INTRAMUSCULAR | Status: DC | PRN

## 2018-07-31 MED ORDER — BUPIVACAINE IN DEXTROSE 0.75-8.25 % IT SOLN
INTRATHECAL | Status: DC | PRN
  Administered 2018-07-31: 1.8 mL via INTRATHECAL

## 2018-07-31 MED ORDER — LIDOCAINE 2% (20 MG/ML) 5 ML SYRINGE
INTRAMUSCULAR | Status: DC | PRN
  Administered 2018-07-31: 20 mg via INTRAVENOUS

## 2018-07-31 MED ORDER — FENTANYL CITRATE (PF) 100 MCG/2ML IJ SOLN
INTRAMUSCULAR | Status: AC
  Filled 2018-07-31: qty 2

## 2018-07-31 MED ORDER — PROMETHAZINE HCL 25 MG/ML IJ SOLN: 6.2500 mg | INTRAMUSCULAR | Status: DC | PRN

## 2018-07-31 MED ORDER — ONDANSETRON HCL 4 MG PO TABS: 4.0000 mg | ORAL_TABLET | Freq: Four times a day (QID) | ORAL | Status: DC | PRN

## 2018-07-31 MED ORDER — EPHEDRINE 5 MG/ML INJ
INTRAVENOUS | Status: AC
  Filled 2018-07-31: qty 10

## 2018-07-31 MED ORDER — MIDAZOLAM HCL 2 MG/2ML IJ SOLN
INTRAMUSCULAR | Status: AC
  Filled 2018-07-31: qty 2

## 2018-07-31 MED ORDER — CEFAZOLIN SODIUM-DEXTROSE 2-4 GM/100ML-% IV SOLN
2.0000 g | Freq: Four times a day (QID) | INTRAVENOUS | Status: AC
  Administered 2018-07-31 – 2018-08-01 (×2): 2 g via INTRAVENOUS
  Filled 2018-07-31 (×2): qty 100

## 2018-07-31 MED ORDER — HYDROMORPHONE HCL 1 MG/ML IJ SOLN: 0.2500 mg | INTRAMUSCULAR | Status: DC | PRN

## 2018-07-31 MED ORDER — 0.9 % SODIUM CHLORIDE (POUR BTL) OPTIME
TOPICAL | Status: DC | PRN
  Administered 2018-07-31: 1000 mL

## 2018-07-31 MED ORDER — MORPHINE SULFATE (PF) 2 MG/ML IV SOLN: 1.0000 mg | INTRAVENOUS | Status: DC | PRN

## 2018-07-31 MED ORDER — DIPHENHYDRAMINE HCL 12.5 MG/5ML PO ELIX: 12.5000 mg | ORAL_SOLUTION | ORAL | Status: DC | PRN

## 2018-07-31 MED ORDER — LACTATED RINGERS IV SOLN
INTRAVENOUS | Status: DC
  Administered 2018-07-31 (×2): via INTRAVENOUS

## 2018-07-31 MED ORDER — METOCLOPRAMIDE HCL 5 MG PO TABS: 5.0000 mg | ORAL_TABLET | Freq: Three times a day (TID) | ORAL | Status: DC | PRN

## 2018-07-31 MED ORDER — ASPIRIN EC 325 MG PO TBEC
325.0000 mg | DELAYED_RELEASE_TABLET | Freq: Two times a day (BID) | ORAL | Status: DC
  Administered 2018-08-01: 325 mg via ORAL
  Filled 2018-07-31: qty 1

## 2018-07-31 MED ORDER — BUPIVACAINE LIPOSOME 1.3 % IJ SUSP
INTRAMUSCULAR | Status: DC | PRN
  Administered 2018-07-31: 20 mL

## 2018-07-31 MED ORDER — ACETAMINOPHEN 10 MG/ML IV SOLN
1000.0000 mg | Freq: Once | INTRAVENOUS | Status: AC
  Administered 2018-07-31: 1000 mg via INTRAVENOUS
  Filled 2018-07-31: qty 100

## 2018-07-31 MED ORDER — CHLORHEXIDINE GLUCONATE 4 % EX LIQD: 60.0000 mL | Freq: Once | CUTANEOUS | Status: DC

## 2018-07-31 MED ORDER — METHOCARBAMOL 500 MG PO TABS
500.0000 mg | ORAL_TABLET | Freq: Four times a day (QID) | ORAL | Status: DC | PRN
  Administered 2018-07-31: 500 mg via ORAL
  Filled 2018-07-31: qty 1

## 2018-07-31 MED ORDER — PHENYLEPHRINE 40 MCG/ML (10ML) SYRINGE FOR IV PUSH (FOR BLOOD PRESSURE SUPPORT)
PREFILLED_SYRINGE | INTRAVENOUS | Status: DC | PRN
  Administered 2018-07-31: 80 ug via INTRAVENOUS
  Administered 2018-07-31: 40 ug via INTRAVENOUS

## 2018-07-31 MED ORDER — PROPOFOL 10 MG/ML IV BOLUS
INTRAVENOUS | Status: AC
  Filled 2018-07-31: qty 60

## 2018-07-31 MED ORDER — PHENOL 1.4 % MT LIQD: 1.0000 | OROMUCOSAL | Status: DC | PRN

## 2018-07-31 MED ORDER — EPHEDRINE SULFATE-NACL 50-0.9 MG/10ML-% IV SOSY
PREFILLED_SYRINGE | INTRAVENOUS | Status: DC | PRN
  Administered 2018-07-31: 10 mg via INTRAVENOUS
  Administered 2018-07-31: 5 mg via INTRAVENOUS

## 2018-07-31 MED ORDER — POLYETHYLENE GLYCOL 3350 17 G PO PACK: 17.0000 g | PACK | Freq: Every day | ORAL | Status: DC | PRN

## 2018-07-31 MED ORDER — TRANEXAMIC ACID-NACL 1000-0.7 MG/100ML-% IV SOLN
1000.0000 mg | INTRAVENOUS | Status: AC
  Administered 2018-07-31: 1000 mg via INTRAVENOUS
  Filled 2018-07-31: qty 100

## 2018-07-31 MED ORDER — SODIUM CHLORIDE (PF) 0.9 % IJ SOLN
INTRAMUSCULAR | Status: DC | PRN
  Administered 2018-07-31: 60 mL

## 2018-07-31 MED ORDER — TRAZODONE HCL 100 MG PO TABS
100.0000 mg | ORAL_TABLET | Freq: Every evening | ORAL | Status: DC | PRN
  Administered 2018-07-31: 100 mg via ORAL
  Filled 2018-07-31: qty 1

## 2018-07-31 MED ORDER — PROPOFOL 500 MG/50ML IV EMUL
INTRAVENOUS | Status: DC | PRN
  Administered 2018-07-31: 75 ug/kg/min via INTRAVENOUS

## 2018-07-31 MED ORDER — SODIUM CHLORIDE (PF) 0.9 % IJ SOLN
INTRAMUSCULAR | Status: AC
  Filled 2018-07-31: qty 10

## 2018-07-31 MED ORDER — METHOCARBAMOL 500 MG IVPB - SIMPLE MED
500.0000 mg | Freq: Four times a day (QID) | INTRAVENOUS | Status: DC | PRN
  Administered 2018-07-31: 500 mg via INTRAVENOUS
  Filled 2018-07-31: qty 50

## 2018-07-31 MED ORDER — ONDANSETRON HCL 4 MG/2ML IJ SOLN
INTRAMUSCULAR | Status: DC | PRN
  Administered 2018-07-31: 4 mg via INTRAVENOUS

## 2018-07-31 MED ORDER — ROPIVACAINE HCL 7.5 MG/ML IJ SOLN
INTRAMUSCULAR | Status: DC | PRN
  Administered 2018-07-31: 20 mL via PERINEURAL

## 2018-07-31 MED ORDER — MENTHOL 3 MG MT LOZG: 1.0000 | LOZENGE | OROMUCOSAL | Status: DC | PRN

## 2018-07-31 MED ORDER — PHENYLEPHRINE HCL 10 MG/ML IJ SOLN
INTRAMUSCULAR | Status: AC
  Filled 2018-07-31: qty 1

## 2018-07-31 MED ORDER — BISACODYL 10 MG RE SUPP: 10.0000 mg | Freq: Every day | RECTAL | Status: DC | PRN

## 2018-07-31 MED ORDER — LIDOCAINE 2% (20 MG/ML) 5 ML SYRINGE
INTRAMUSCULAR | Status: AC
  Filled 2018-07-31: qty 5

## 2018-07-31 MED ORDER — OXYCODONE HCL 5 MG PO TABS
5.0000 mg | ORAL_TABLET | ORAL | Status: DC | PRN
  Administered 2018-07-31: 5 mg via ORAL
  Filled 2018-07-31: qty 1

## 2018-07-31 MED ORDER — ONDANSETRON HCL 4 MG/2ML IJ SOLN: 4.0000 mg | Freq: Four times a day (QID) | INTRAMUSCULAR | Status: DC | PRN

## 2018-07-31 MED ORDER — FENTANYL CITRATE (PF) 100 MCG/2ML IJ SOLN
50.0000 ug | INTRAMUSCULAR | Status: DC
  Administered 2018-07-31: 100 ug via INTRAVENOUS
  Filled 2018-07-31: qty 2

## 2018-07-31 MED ORDER — METHOCARBAMOL 500 MG IVPB - SIMPLE MED
INTRAVENOUS | Status: AC
  Administered 2018-07-31: 500 mg via INTRAVENOUS
  Filled 2018-07-31: qty 50

## 2018-07-31 MED ORDER — MIDAZOLAM HCL 2 MG/2ML IJ SOLN
1.0000 mg | INTRAMUSCULAR | Status: DC
  Administered 2018-07-31: 2 mg via INTRAVENOUS
  Filled 2018-07-31: qty 2

## 2018-07-31 MED ORDER — PROPOFOL 10 MG/ML IV BOLUS
INTRAVENOUS | Status: AC
  Filled 2018-07-31: qty 20

## 2018-07-31 MED ORDER — SODIUM CHLORIDE 0.9 % IV SOLN
INTRAVENOUS | Status: DC
  Administered 2018-07-31: 17:00:00 via INTRAVENOUS

## 2018-07-31 MED ORDER — PHENYLEPHRINE 40 MCG/ML (10ML) SYRINGE FOR IV PUSH (FOR BLOOD PRESSURE SUPPORT)
PREFILLED_SYRINGE | INTRAVENOUS | Status: AC
  Filled 2018-07-31: qty 10

## 2018-07-31 MED ORDER — FLEET ENEMA 7-19 GM/118ML RE ENEM: 1.0000 | ENEMA | Freq: Once | RECTAL | Status: DC | PRN

## 2018-07-31 MED ORDER — ONDANSETRON HCL 4 MG/2ML IJ SOLN
INTRAMUSCULAR | Status: AC
  Filled 2018-07-31: qty 2

## 2018-07-31 MED ORDER — ATORVASTATIN CALCIUM 40 MG PO TABS
40.0000 mg | ORAL_TABLET | Freq: Every day | ORAL | Status: DC
  Administered 2018-08-01: 40 mg via ORAL
  Filled 2018-07-31: qty 1

## 2018-07-31 MED ORDER — DEXAMETHASONE SODIUM PHOSPHATE 10 MG/ML IJ SOLN
INTRAMUSCULAR | Status: AC
  Filled 2018-07-31: qty 1

## 2018-07-31 MED ORDER — OXYCODONE HCL 5 MG PO TABS: 10.0000 mg | ORAL_TABLET | ORAL | Status: DC | PRN

## 2018-07-31 MED ORDER — METOCLOPRAMIDE HCL 5 MG/ML IJ SOLN: 5.0000 mg | Freq: Three times a day (TID) | INTRAMUSCULAR | Status: DC | PRN

## 2018-07-31 MED ORDER — ACETAMINOPHEN 500 MG PO TABS
1000.0000 mg | ORAL_TABLET | Freq: Four times a day (QID) | ORAL | Status: DC
  Administered 2018-07-31 – 2018-08-01 (×3): 1000 mg via ORAL
  Filled 2018-07-31 (×3): qty 2

## 2018-07-31 MED ORDER — DEXAMETHASONE SODIUM PHOSPHATE 10 MG/ML IJ SOLN
10.0000 mg | Freq: Once | INTRAMUSCULAR | Status: AC
  Administered 2018-08-01: 10 mg via INTRAVENOUS
  Filled 2018-07-31: qty 1

## 2018-07-31 MED ORDER — DEXAMETHASONE SODIUM PHOSPHATE 10 MG/ML IJ SOLN
8.0000 mg | Freq: Once | INTRAMUSCULAR | Status: AC
  Administered 2018-07-31: 10 mg via INTRAVENOUS

## 2018-07-31 MED ORDER — GABAPENTIN 300 MG PO CAPS
300.0000 mg | ORAL_CAPSULE | Freq: Three times a day (TID) | ORAL | Status: DC
  Administered 2018-07-31 – 2018-08-01 (×3): 300 mg via ORAL
  Filled 2018-07-31 (×3): qty 1

## 2018-07-31 MED ORDER — PROPOFOL 10 MG/ML IV BOLUS
INTRAVENOUS | Status: DC | PRN
  Administered 2018-07-31: 10 mg via INTRAVENOUS

## 2018-07-31 MED ORDER — DOCUSATE SODIUM 100 MG PO CAPS
100.0000 mg | ORAL_CAPSULE | Freq: Two times a day (BID) | ORAL | Status: DC
  Administered 2018-07-31 – 2018-08-01 (×2): 100 mg via ORAL
  Filled 2018-07-31 (×2): qty 1

## 2018-07-31 MED ORDER — SODIUM CHLORIDE (PF) 0.9 % IJ SOLN
INTRAMUSCULAR | Status: AC
  Filled 2018-07-31: qty 50

## 2018-07-31 SURGICAL SUPPLY — 56 items
ANCH SUT 2 CP-2 EBND QANCHR+ (Anchor) ×1 IMPLANT
ANCHOR SUPER QUICK (Anchor) ×1 IMPLANT
BAG DECANTER FOR FLEXI CONT (MISCELLANEOUS) ×2 IMPLANT
BAG SPEC THK2 15X12 ZIP CLS (MISCELLANEOUS)
BAG ZIPLOCK 12X15 (MISCELLANEOUS) IMPLANT
BANDAGE ACE 6X5 VEL STRL LF (GAUZE/BANDAGES/DRESSINGS) ×2 IMPLANT
BANDAGE ELASTIC 6 VELCRO ST LF (GAUZE/BANDAGES/DRESSINGS) ×1 IMPLANT
BLADE SAG 18X100X1.27 (BLADE) ×1 IMPLANT
BLADE SAW SGTL 11.0X1.19X90.0M (BLADE) ×2 IMPLANT
BLADE SURG SZ10 CARB STEEL (BLADE) ×4 IMPLANT
CLOTH BEACON ORANGE TIMEOUT ST (SAFETY) ×2 IMPLANT
COVER SURGICAL LIGHT HANDLE (MISCELLANEOUS) ×2 IMPLANT
COVER WAND RF STERILE (DRAPES) IMPLANT
CUFF TOURN SGL QUICK 34 (TOURNIQUET CUFF) ×2
CUFF TRNQT CYL 34X4.125X (TOURNIQUET CUFF) ×1 IMPLANT
DECANTER SPIKE VIAL GLASS SM (MISCELLANEOUS) IMPLANT
DRAPE U-SHAPE 47X51 STRL (DRAPES) ×2 IMPLANT
DRSG ADAPTIC 3X8 NADH LF (GAUZE/BANDAGES/DRESSINGS) ×2 IMPLANT
DRSG PAD ABDOMINAL 8X10 ST (GAUZE/BANDAGES/DRESSINGS) ×2 IMPLANT
DURAPREP 26ML APPLICATOR (WOUND CARE) ×2 IMPLANT
ELECT REM PT RETURN 15FT ADLT (MISCELLANEOUS) ×2 IMPLANT
EVACUATOR 1/8 PVC DRAIN (DRAIN) ×2 IMPLANT
GAUZE SPONGE 4X4 12PLY STRL (GAUZE/BANDAGES/DRESSINGS) ×2 IMPLANT
GLOVE BIO SURGEON STRL SZ7 (GLOVE) ×2 IMPLANT
GLOVE BIO SURGEON STRL SZ8 (GLOVE) ×2 IMPLANT
GLOVE BIOGEL PI IND STRL 7.0 (GLOVE) ×1 IMPLANT
GLOVE BIOGEL PI IND STRL 8 (GLOVE) ×1 IMPLANT
GLOVE BIOGEL PI INDICATOR 7.0 (GLOVE) ×1
GLOVE BIOGEL PI INDICATOR 8 (GLOVE) ×1
GOWN STRL REUS W/TWL LRG LVL3 (GOWN DISPOSABLE) ×4 IMPLANT
HANDPIECE INTERPULSE COAX TIP (DISPOSABLE) ×2
HOLDER FOLEY CATH W/STRAP (MISCELLANEOUS) IMPLANT
IMMOBILIZER KNEE 20 (SOFTGOODS) ×2
IMMOBILIZER KNEE 20 THIGH 36 (SOFTGOODS) ×1 IMPLANT
INSERT TIB ATTUNE RP SZ9X16 (Insert) ×1 IMPLANT
MANIFOLD NEPTUNE II (INSTRUMENTS) ×2 IMPLANT
NS IRRIG 1000ML POUR BTL (IV SOLUTION) ×2 IMPLANT
PACK TOTAL KNEE CUSTOM (KITS) ×2 IMPLANT
PAD ABD 7.5X8 STRL (GAUZE/BANDAGES/DRESSINGS) ×1 IMPLANT
PADDING CAST COTTON 6X4 STRL (CAST SUPPLIES) ×4 IMPLANT
PROTECTOR NERVE ULNAR (MISCELLANEOUS) ×2 IMPLANT
SET HNDPC FAN SPRY TIP SCT (DISPOSABLE) ×1 IMPLANT
STRIP CLOSURE SKIN 1/2X4 (GAUZE/BANDAGES/DRESSINGS) ×3 IMPLANT
SUT MNCRL AB 4-0 PS2 18 (SUTURE) ×2 IMPLANT
SUT STRATAFIX 0 PDS 27 VIOLET (SUTURE) ×2
SUT VIC AB 2-0 CT1 27 (SUTURE) ×6
SUT VIC AB 2-0 CT1 TAPERPNT 27 (SUTURE) ×3 IMPLANT
SUTURE STRATFX 0 PDS 27 VIOLET (SUTURE) ×1 IMPLANT
SWAB COLLECTION DEVICE MRSA (MISCELLANEOUS) IMPLANT
SWAB CULTURE ESWAB REG 1ML (MISCELLANEOUS) IMPLANT
SYR 50ML LL SCALE MARK (SYRINGE) ×4 IMPLANT
TOWER CARTRIDGE SMART MIX (DISPOSABLE) ×1 IMPLANT
TRAY FOLEY MTR SLVR 16FR STAT (SET/KITS/TRAYS/PACK) ×2 IMPLANT
TUBE KAMVAC SUCTION (TUBING) IMPLANT
WATER STERILE IRR 1000ML POUR (IV SOLUTION) ×2 IMPLANT
WRAP KNEE MAXI GEL POST OP (GAUZE/BANDAGES/DRESSINGS) ×1 IMPLANT

## 2018-07-31 NOTE — Progress Notes (Signed)
Assisted Dr. Carswell Jackson with left, ultrasound guided, adductor canal block. Side rails up, monitors on throughout procedure. See vital signs in flow sheet. Tolerated Procedure well.  

## 2018-07-31 NOTE — Plan of Care (Signed)
  Problem: Education: Goal: Knowledge of General Education information will improve Description: Including pain rating scale, medication(s)/side effects and non-pharmacologic comfort measures Outcome: Progressing   Problem: Clinical Measurements: Goal: Will remain free from infection Outcome: Progressing Goal: Respiratory complications will improve Outcome: Progressing   Problem: Pain Managment: Goal: General experience of comfort will improve Outcome: Progressing   Problem: Safety: Goal: Ability to remain free from injury will improve Outcome: Progressing   

## 2018-07-31 NOTE — Op Note (Signed)
NAME: John Bowen, John Bowen MEDICAL RECORD PI:[id number] ACCOUNT [id number] DATE OF BIRTH:March 15, 1956 FACILITY: [facility] LOCATION: [facility] PHYSICIAN:Jeziah Kretschmer Zella Ball, MD  OPERATIVE REPORT  DATE OF PROCEDURE:  07/31/2018  PREOPERATIVE DIAGNOSIS:  Unstable left total knee arthroplasty.  POSTOPERATIVE DIAGNOSIS:  Unstable left total knee arthroplasty.  PROCEDURE:  Left knee tibial polyethylene revision.  SURGEON:  Gaynelle Arabian, MD  ASSISTANT:  Griffith Citron, PA-C  ANESTHESIA:  Adductor canal block and spinal.  ESTIMATED BLOOD LOSS:  Minimal.  DRAINS:  Hemovac x1.  TOURNIQUET TIME:  47 minutes at 300 mmHg.  COMPLICATIONS:  None.  CONDITION:  Stable to recovery.  BRIEF CLINICAL NOTE:  The patient is a 63 year old male who had a left total knee arthroplasty done approximately 2 years ago.  He has developed instability in the knee and pain and effusion associated with it.  Infection workup was negative with a  negative bone scan.  He presents now for polyethylene versus total knee revision.  PROCEDURE IN DETAIL:  After successful administration of adductor canal block and spinal anesthetic, a tourniquet was placed high on his left thigh and left lower extremity prepped and draped in the usual sterile fashion.  Exam under anesthesia shows  that he has got a fair amount of varus valgus laxity in full extension as well as AP laxity in 90 degrees of flexion.  Extremity was wrapped in Esmarch, knee flexed and tourniquet inflated to 300 mmHg.  Midline incision was made with a #10 blade through  subcutaneous tissue to the level of the extensor mechanism.  A fresh blade was used to make a medial parapatellar arthrotomy.  There was a small amount of clear synovial fluid present in the joint.  Soft tissue over the proximal medial tibia  subperiosteally elevated to the joint line with a knife and into the semimembranosus bursa with a Cobb elevator.  Soft tissue laterally was elevated with attention  being paid to avoid the patellar tendon on the tibial tubercle.  I then excised the scar  tissue from underneath the extensor mechanism, medial and lateral as well as in the suprapatellar area.  I excised a lot of scar in the infrapatellar region and again attention being paid to avoid the patellar tendon.  I was then able to evert the  patella.  I flexed the knee to 90 degrees.  The tibia would not dislocate anteriorly.  I was able to remove the tibial polyethylene by using oscillating saw to disrupt the conical portion of the underside of the polyethylene.  The polyethylene was  removed.  It was a size 9-10 mm thickness rotating platform posterior stabilized polyethylene.  I inspected the interface between metal and bone throughout both the tibial and femoral components and both were well fixed and in excellent position.  We  then trialled thicker inserts up going up to 14 mm, which had a tiny bit of the anterior/posterior plane.  I went up to 16, which allowed for full extension with excellent varus/valgus and posterior stability through full range of motion.  We thoroughly  irrigated the knee with 2 liters of saline using pulsatile lavage.  I removed the trial insert and placed the permanent 16 mm size 9 Attune posterior stabilized rotating platform insert into the tibial tray.  The knee was reduced again with excellent  stability with full extension with excellent varus/valgus stability through full range of motion as well as excellent AP stability through full range of motion.  Further irrigation was then performed.  We did note  that about 25% of the patellar tendon  had peeled back from the tibial tubercle.  I wanted to stabilize this further and placed a Mitek suture anchor into the tibial tubercle and then passed it through the tendon to sew it back down to the tubercle.  The arthrotomy was then closed over a  Hemovac drain with running 0 Stratafix suture.  Flexion against gravity was 130 degrees  and the patella tracks normally.  The repair distally remained intact.  There was excellent stability throughout full range of motion.  The tourniquet was then  released for a total time of 47 minutes.  Minor bleeding was stopped with electrocautery.  Subcutaneous was closed with interrupted 2-0 Vicryl and subcuticular running 4-0 Monocryl.  Please note that prior to closure, a total of 20 mL of Exparel mixed with 60 mL of saline was injected into the posterior capsule, the extensor mechanism, the periosteum of the femur and subcutaneous tissues.  Once closed, the knee incision was cleaned  and dried and Steri-Strips and a bulky sterile dressing applied.  Flexion against gravity before placing the dressing is again 130 degrees.  A dressing was placed and he is subsequently awakened and transported to recovery in stable condition.  Note that a surgical assistant was a medical necessity for this case to perform it safely and expeditiously.  Assistance was necessary for retraction of vital ligaments and neurovascular structures both in removal of the old implant and for safe and  proper placement of the new implant.  TN/NUANCE  D:07/31/2018 T:07/31/2018 JOB:[id number]

## 2018-07-31 NOTE — Brief Op Note (Signed)
07/31/2018  1:51 PM  PATIENT:  Sidney Ace  63 y.o. male  PRE-OPERATIVE DIAGNOSIS:  unstable left total knee arthroplasty  POST-OPERATIVE DIAGNOSIS:  unstable left total knee arthroplasty  PROCEDURE:  Procedure(s) with comments: Left knee polyethylene revision (Left) - 31min  SURGEON:  Surgeon(s) and Role:    Gaynelle Arabian, MD - Primary  PHYSICIAN ASSISTANT:   ASSISTANTS: Griffith Citron, PA-C   ANESTHESIA:   Adductor canal block and spinal  EBL:  25 mL   BLOOD ADMINISTERED:none  DRAINS: (Medium) Hemovact drain(s) in the left knee with  Suction Open   LOCAL MEDICATIONS USED:  OTHER Exparel  COUNTS:  YES  TOURNIQUET:   Total Tourniquet Time Documented: Thigh (Left) - 47 minutes Total: Thigh (Left) - 47 minutes   DICTATION: .Other Dictation: Dictation Number [id number]  PLAN OF CARE: Admit for overnight observation  PATIENT DISPOSITION:  PACU - hemodynamically stable.

## 2018-07-31 NOTE — Interval H&P Note (Signed)
History and Physical Interval Note:  07/31/2018 11:20 AM  John Bowen  has presented today for surgery, with the diagnosis of unstable left total knee arthroplasty  The various methods of treatment have been discussed with the patient and family. After consideration of risks, benefits and other options for treatment, the patient has consented to  Procedure(s) with comments: Left knee polyethylene vs total knee arthroplasty revision (Left) - 61min as a surgical intervention .  The patient's history has been reviewed, patient examined, no change in status, stable for surgery.  I have reviewed the patient's chart and labs.  Questions were answered to the patient's satisfaction.     Pilar Plate Jaleigha Deane

## 2018-07-31 NOTE — Anesthesia Postprocedure Evaluation (Signed)
Anesthesia Post Note  Patient: John Bowen  Procedure(s) Performed: Left knee polyethylene revision (Left Knee)     Patient location during evaluation: PACU Anesthesia Type: Spinal Level of consciousness: awake and alert, oriented and patient cooperative Pain management: pain level controlled Vital Signs Assessment: post-procedure vital signs reviewed and stable Respiratory status: spontaneous breathing, nonlabored ventilation, respiratory function stable and patient connected to nasal cannula oxygen Cardiovascular status: blood pressure returned to baseline and stable Postop Assessment: spinal receding, patient able to bend at knees and no apparent nausea or vomiting Anesthetic complications: no    Last Vitals:  Vitals:   07/31/18 1534 07/31/18 1632  BP: (!) 141/75 128/73  Pulse: (!) 56 (!) 58  Resp: 14 18  Temp: 36.4 C (!) 36.4 C  SpO2: 100% 100%    Last Pain:  Vitals:   07/31/18 1632  TempSrc: Oral  PainSc:                  Paulla Mcclaskey,E. Jerica Creegan

## 2018-07-31 NOTE — Discharge Instructions (Signed)
Dr. Gaynelle Arabian Total Joint Specialist [facility] [address], [place], [place] [address] [phone]  TOTAL KNEE POLYETHYLENE REVISION POSTOPERATIVE DIRECTIONS  Knee Rehabilitation, Guidelines Following Surgery  Results after knee surgery are often greatly improved when you follow the exercise, range of motion and muscle strengthening exercises prescribed by your doctor. Safety measures are also important to protect the knee from further injury. Any time any of these exercises cause you to have increased pain or swelling in your knee joint, decrease the amount until you are comfortable again and slowly increase them. If you have problems or questions, call your caregiver or physical therapist for advice.   HOME CARE INSTRUCTIONS   Remove items at home which could result in a fall. This includes throw rugs or furniture in walking pathways.   ICE to the affected knee every three hours for 30 minutes at a time and then as needed for pain and swelling.  Continue to use ice on the knee for pain and swelling from surgery. You may notice swelling that will progress down to the foot and ankle.  This is normal after surgery.  Elevate the leg when you are not up walking on it.    Continue to use the breathing machine which will help keep your temperature down.  It is common for your temperature to cycle up and down following surgery, especially at night when you are not up moving around and exerting yourself.  The breathing machine keeps your lungs expanded and your temperature down.  Do not place pillow under knee, focus on keeping the knee straight while resting  DIET You may resume your previous home diet once your are discharged from the hospital.  DRESSING / WOUND CARE / SHOWERING You may change your dressing 3-5 days after surgery.  Then change the dressing every day with sterile gauze.  Please use good hand washing techniques before changing the dressing.  Do not use  any lotions or creams on the incision until instructed by your surgeon. You may start showering once you are discharged home but do not submerge the incision under water. Just pat the incision dry and apply a dry gauze dressing on daily. Change the surgical dressing daily and reapply a dry dressing each time.  ACTIVITY Walk with your walker as instructed. Use walker as long as suggested by your caregivers. Avoid periods of inactivity such as sitting longer than an hour when not asleep. This helps prevent blood clots.  You may resume a sexual relationship in one month or when given the OK by your doctor.  You may return to work once you are cleared by your doctor.  Do not drive a car for 6 weeks or until released by you surgeon.  Do not drive while taking narcotics.  WEIGHT BEARING Weight bearing as tolerated with assist device (walker, cane, etc) as directed, use it as long as suggested by your surgeon or therapist, typically at least 4-6 weeks.  POSTOPERATIVE CONSTIPATION PROTOCOL Constipation - defined medically as fewer than three stools per week and severe constipation as less than one stool per week.  One of the most common issues patients have following surgery is constipation.  Even if you have a regular bowel pattern at home, your normal regimen is likely to be disrupted due to multiple reasons following surgery.  Combination of anesthesia, postoperative narcotics, change in appetite and fluid intake all can affect your bowels.  In order to avoid complications following surgery, here are  some recommendations in order to help you during your recovery period.  Colace (docusate) - Pick up an over-the-counter form of Colace or another stool softener and take twice a day as long as you are requiring postoperative pain medications.  Take with a full glass of water daily.  If you experience loose stools or diarrhea, hold the colace until you stool forms back up.  If your symptoms do not get  better within 1 week or if they get worse, check with your doctor.  Dulcolax (bisacodyl) - Pick up over-the-counter and take as directed by the product packaging as needed to assist with the movement of your bowels.  Take with a full glass of water.  Use this product as needed if not relieved by Colace only.   MiraLax (polyethylene glycol) - Pick up over-the-counter to have on hand.  MiraLax is a solution that will increase the amount of water in your bowels to assist with bowel movements.  Take as directed and can mix with a glass of water, juice, soda, coffee, or tea.  Take if you go more than two days without a movement. Do not use MiraLax more than once per day. Call your doctor if you are still constipated or irregular after using this medication for 7 days in a row.  If you continue to have problems with postoperative constipation, please contact the office for further assistance and recommendations.  If you experience "the worst abdominal pain ever" or develop nausea or vomiting, please contact the office immediatly for further recommendations for treatment.  ITCHING  If you experience itching with your medications, try taking only a single pain pill, or even half a pain pill at a time.  You can also use Benadryl over the counter for itching or also to help with sleep.   TED HOSE STOCKINGS Wear the elastic stockings on both legs for three weeks following surgery during the day but you may remove then at night for sleeping.  MEDICATIONS See your medication summary on the After Visit Summary that the nursing staff will review with you prior to discharge.  You may have some home medications which will be placed on hold until you complete the course of blood thinner medication.  It is important for you to complete the blood thinner medication as prescribed by your surgeon.  Continue your approved medications as instructed at time of discharge.  PRECAUTIONS If you experience chest pain or  shortness of breath - call 911 immediately for transfer to the hospital emergency department.  If you develop a fever greater that 101 F, purulent drainage from wound, increased redness or drainage from wound, foul odor from the wound/dressing, or calf pain - CONTACT YOUR SURGEON.                                                   FOLLOW-UP APPOINTMENTS Make sure you keep all of your appointments after your operation with your surgeon and caregivers. You should call the office at the above phone number and make an appointment for approximately two weeks after the date of your surgery or on the date instructed by your surgeon outlined in the "After Visit Summary".   RANGE OF MOTION AND STRENGTHENING EXERCISES  Rehabilitation of the knee is important following a knee injury or an operation. After just a few days of immobilization,  the muscles of the thigh which control the knee become weakened and shrink (atrophy). Knee exercises are designed to build up the tone and strength of the thigh muscles and to improve knee motion. Often times heat used for twenty to thirty minutes before working out will loosen up your tissues and help with improving the range of motion but do not use heat for the first two weeks following surgery. These exercises can be done on a training (exercise) mat, on the floor, on a table or on a bed. Use what ever works the best and is most comfortable for you Knee exercises include:   Leg Lifts - While your knee is still immobilized in a splint or cast, you can do straight leg raises. Lift the leg to 60 degrees, hold for 3 sec, and slowly lower the leg. Repeat 10-20 times 2-3 times daily. Perform this exercise against resistance later as your knee gets better.   Quad and Hamstring Sets - Tighten up the muscle on the front of the thigh (Quad) and hold for 5-10 sec. Repeat this 10-20 times hourly. Hamstring sets are done by pushing the foot backward against an object and holding for 5-10  sec. Repeat as with quad sets.   Leg Slides: Lying on your back, slowly slide your foot toward your buttocks, bending your knee up off the floor (only go as far as is comfortable). Then slowly slide your foot back down until your leg is flat on the floor again.  Angel Wings: Lying on your back spread your legs to the side as far apart as you can without causing discomfort.  A rehabilitation program following serious knee injuries can speed recovery and prevent re-injury in the future due to weakened muscles. Contact your doctor or a physical therapist for more information on knee rehabilitation.   IF YOU ARE TRANSFERRED TO A SKILLED REHAB FACILITY If the patient is transferred to a skilled rehab facility following release from the hospital, a list of the current medications will be sent to the facility for the patient to continue.  When discharged from the skilled rehab facility, please have the facility set up the patient's [place] prior to being released. Also, the skilled facility will be responsible for providing the patient with their medications at time of release from the facility to include their pain medication, the muscle relaxants, and their blood thinner medication. If the patient is still at the rehab facility at time of the two week follow up appointment, the skilled rehab facility will also need to assist the patient in arranging follow up appointment in our office and any transportation needs.  MAKE SURE YOU:   Understand these instructions.   Get help right away if you are not doing well or get worse.    Pick up stool softner and laxative for home use following surgery while on pain medications. Do not submerge incision under water. Please use good hand washing techniques while changing dressing each day. May shower starting three days after surgery. Please use a clean towel to pat the incision dry following showers. Continue to use ice for pain and  swelling after surgery. Do not use any lotions or creams on the incision until instructed by your surgeon.

## 2018-07-31 NOTE — Anesthesia Procedure Notes (Signed)
Anesthesia Regional Block: Adductor canal block   Pre-Anesthetic Checklist: ,, timeout performed, Correct Patient, Correct Site, Correct Laterality, Correct Procedure, Correct Position, site marked, Risks and benefits discussed,  Surgical consent,  Pre-op evaluation,  At surgeon's request and post-op pain management  Laterality: Left and Lower  Prep: chloraprep       Needles:  Injection technique: Single-shot  Needle Type: Echogenic Needle     Needle Length: 9cm  Needle Gauge: 21     Additional Needles:   Narrative:  Start time: 07/31/2018 11:28 AM End time: 07/31/2018 11:34 AM Injection made incrementally with aspirations every 5 mL.  Performed by: Personally  Anesthesiologist: Annye Asa, MD  Additional Notes: Pt identified in Holding room.  Monitors applied. Working IV access confirmed. Sterile prep, drape L thigh.  #21ga ECHOgenic needle into adductor canal with US guidance.  20cc 0.75% Ropivacaine injected incrementally after negative test dose.  Patient asymptomatic, VSS, no heme aspirated, tolerated well.  Jenita Seashore, MD

## 2018-07-31 NOTE — Transfer of Care (Signed)
Immediate Anesthesia Transfer of Care Note  Patient: John Bowen  Procedure(s) Performed: Left knee polyethylene revision (Left Knee)  Patient Location: PACU  Anesthesia Type:Regional and Spinal  Level of Consciousness: awake, alert  and oriented  Airway & Oxygen Therapy: Patient Spontanous Breathing and Patient connected to face mask oxygen  Post-op Assessment: Report given to RN and Post -op Vital signs reviewed and stable  Post vital signs: Reviewed and stable  Last Vitals:  Vitals Value Taken Time  BP    Temp    Pulse    Resp    SpO2      Last Pain:  Vitals:   07/31/18 1054  TempSrc:   PainSc: 4          Complications: No apparent anesthesia complications

## 2018-07-31 NOTE — Anesthesia Preprocedure Evaluation (Addendum)
Anesthesia Evaluation  Patient identified by MRN, date of birth, ID band Patient awake    Reviewed: Allergy & Precautions, NPO status , Patient's Chart, lab work & pertinent test results  History of Anesthesia Complications (+) PONV  Airway Mallampati: II  TM Distance: >3 FB Neck ROM: Full    Dental  (+) Chipped, Dental Advisory Given   Pulmonary sleep apnea (does not tolerate CPAP) ,    breath sounds clear to auscultation       Cardiovascular hypertension, Pt. on medications (-) angina Rhythm:Regular Rate:Normal     Neuro/Psych negative neurological ROS     GI/Hepatic negative GI ROS, Neg liver ROS,   Endo/Other  negative endocrine ROS  Renal/GU negative Renal ROS     Musculoskeletal  (+) Arthritis , Osteoarthritis,    Abdominal   Peds  Hematology negative hematology ROS (+)   Anesthesia Other Findings   Reproductive/Obstetrics                            Anesthesia Physical Anesthesia Plan  ASA: III  Anesthesia Plan: Spinal   Post-op Pain Management:  Regional for Post-op pain   Induction:   PONV Risk Score and Plan: 2 and Ondansetron and Dexamethasone  Airway Management Planned: Natural Airway and Simple Face Mask  Additional Equipment:   Intra-op Plan:   Post-operative Plan:   Informed Consent: I have reviewed the patients History and Physical, chart, labs and discussed the procedure including the risks, benefits and alternatives for the proposed anesthesia with the patient or authorized representative who has indicated his/her understanding and acceptance.     Dental advisory given  Plan Discussed with: CRNA and Surgeon  Anesthesia Plan Comments: (Plan routine monitors, SAB with adductor canal block for post op analgesia)       Anesthesia Quick Evaluation

## 2018-07-31 NOTE — Evaluation (Signed)
Physical Therapy Evaluation Patient Details Name: John Bowen MRN: [id number] DOB: 05/11/1956 Today's Date: 07/31/2018   History of Present Illness  L TKA revision  Clinical Impression  Pt is s/p TKA revision resulting in the deficits listed below (see PT Problem List). Pt ambulated 55' with RW, no loss of balance. Initiated TKA HEP. Good progress expected.  Pt will benefit from skilled PT to increase their independence and safety with mobility to allow discharge to the venue listed below.      Follow Up Recommendations Follow surgeon's recommendation for DC plan and follow-up therapies    Equipment Recommendations  None recommended by PT    Recommendations for Other Services       Precautions / Restrictions Precautions Precautions: Knee Precaution Booklet Issued: Yes (comment) Precaution Comments: reviewed no pillow under knee Restrictions Weight Bearing Restrictions: No      Mobility  Bed Mobility Overal bed mobility: Modified Independent             General bed mobility comments: HOB up   Transfers Overall transfer level: Needs assistance Equipment used: Rolling walker (2 wheeled) Transfers: Sit to/from Stand Sit to Stand: Min guard         General transfer comment: VCs hand placement   Ambulation/Gait Ambulation/Gait assistance: Supervision Gait Distance (Feet): 80 Feet Assistive device: Rolling walker (2 wheeled) Gait Pattern/deviations: Step-to pattern;Decreased stance time - left Gait velocity: decr   General Gait Details: VCs sequencing, no loss of balance  Stairs            Wheelchair Mobility    Modified Rankin (Stroke Patients Only)       Balance Overall balance assessment: Modified Independent                                           Pertinent Vitals/Pain Pain Assessment: (P) 0-10 Pain Score: 3  Pain Location: L knee with flexion Pain Descriptors / Indicators: Sore Pain Intervention(s): Limited  activity within patient's tolerance;Monitored during session;Premedicated before session;Ice applied    Home Living Family/patient expects to be discharged to:: Private residence Living Arrangements: Spouse/significant other Available Help at Discharge: Family Type of Home: House Home Access: Stairs to enter Entrance Stairs-Rails: Right Entrance Stairs-Number of Steps: 3 Home Layout: Able to live on main level with bedroom/bathroom;Two level Home Equipment: Walker - 2 wheels;Crutches;Cane - single point      Prior Function Level of Independence: Independent               Hand Dominance        Extremity/Trunk Assessment   Upper Extremity Assessment Upper Extremity Assessment: Overall WFL for tasks assessed    Lower Extremity Assessment Lower Extremity Assessment: LLE deficits/detail LLE Deficits / Details: 5-45* AAROM L knee, flexion limited by tightness from steristrips per pt LLE Sensation: WNL    Cervical / Trunk Assessment Cervical / Trunk Assessment: Normal  Communication   Communication: No difficulties  Cognition Arousal/Alertness: Awake/alert Behavior During Therapy: WFL for tasks assessed/performed Overall Cognitive Status: Within Functional Limits for tasks assessed                                        General Comments      Exercises Total Joint Exercises Ankle Circles/Pumps: AROM;Both;10 reps;Supine Quad Sets: AROM;Left;5 reps;Supine  Heel Slides: AAROM;Left;10 reps;Supine Long Arc Quad: AROM;Left;10 reps;Seated Goniometric ROM: 5-45* AAROM L knee   Assessment/Plan    PT Assessment Patient needs continued PT services  PT Problem List Decreased strength;Decreased range of motion;Decreased mobility;Decreased activity tolerance;Pain       PT Treatment Interventions DME instruction;Gait training;Stair training;Functional mobility training;Therapeutic activities;Patient/family education;Therapeutic exercise    PT Goals  (Current goals can be found in the Care Plan section)  Acute Rehab PT Goals Patient Stated Goal: golf PT Goal Formulation: With patient/family Time For Goal Achievement: 08/07/18 Potential to Achieve Goals: Good    Frequency 7X/week   Barriers to discharge        Co-evaluation               AM-PAC PT "6 Clicks" Mobility  Outcome Measure Help needed turning from your back to your side while in a flat bed without using bedrails?: A Little Help needed moving from lying on your back to sitting on the side of a flat bed without using bedrails?: A Little Help needed moving to and from a bed to a chair (including a wheelchair)?: A Little Help needed standing up from a chair using your arms (e.g., wheelchair or bedside chair)?: A Little Help needed to walk in hospital room?: A Little Help needed climbing 3-5 steps with a railing? : A Lot 6 Click Score: 17    End of Session Equipment Utilized During Treatment: Gait belt Activity Tolerance: Patient tolerated treatment well Patient left: in chair;with call bell/phone within reach;with family/visitor present Nurse Communication: Mobility status PT Visit Diagnosis: Difficulty in walking, not elsewhere classified (R26.2);Pain Pain - Right/Left: Left Pain - part of body: Knee    Time: 8588-5027 PT Time Calculation (min) (ACUTE ONLY): 27 min   Charges:   PT Evaluation $PT Eval Low Complexity: 1 Low PT Treatments $Gait Training: 8-22 mins       Blondell Reveal Kistler PT 07/31/2018  Acute Rehabilitation Services Pager [phone] Office [phone]

## 2018-08-01 ENCOUNTER — Encounter (HOSPITAL_COMMUNITY): Payer: Self-pay | Admitting: Orthopedic Surgery

## 2018-08-01 LAB — CBC
HCT: 36.9 % — ABNORMAL LOW (ref 39.0–52.0)
Hemoglobin: 11.5 g/dL — ABNORMAL LOW (ref 13.0–17.0)
MCH: 29.3 pg (ref 26.0–34.0)
MCHC: 31.2 g/dL (ref 30.0–36.0)
MCV: 94.1 fL (ref 80.0–100.0)
PLATELETS: 274 10*3/uL (ref 150–400)
RBC: 3.92 MIL/uL — ABNORMAL LOW (ref 4.22–5.81)
RDW: 12.6 % (ref 11.5–15.5)
WBC: 8.3 10*3/uL (ref 4.0–10.5)
nRBC: 0 % (ref 0.0–0.2)

## 2018-08-01 LAB — BASIC METABOLIC PANEL
Anion gap: 3 — ABNORMAL LOW (ref 5–15)
BUN: 14 mg/dL (ref 8–23)
CO2: 24 mmol/L (ref 22–32)
Calcium: 8.1 mg/dL — ABNORMAL LOW (ref 8.9–10.3)
Chloride: 112 mmol/L — ABNORMAL HIGH (ref 98–111)
Creatinine, Ser: 0.9 mg/dL (ref 0.61–1.24)
GFR calc Af Amer: >60 mL/min (ref >60)
Glucose, Bld: 130 mg/dL — ABNORMAL HIGH (ref 70–99)
POTASSIUM: 4 mmol/L (ref 3.5–5.1)
Sodium: 139 mmol/L (ref 135–145)

## 2018-08-01 MED ORDER — GABAPENTIN 300 MG PO CAPS: 300.0000 mg | ORAL_CAPSULE | Freq: Three times a day (TID) | ORAL | 0 refills | Status: DC

## 2018-08-01 MED ORDER — METHOCARBAMOL 500 MG PO TABS: 500.0000 mg | ORAL_TABLET | Freq: Four times a day (QID) | ORAL | 0 refills | Status: DC | PRN

## 2018-08-01 MED ORDER — OXYCODONE HCL 5 MG PO TABS: 5.0000 mg | ORAL_TABLET | Freq: Four times a day (QID) | ORAL | 0 refills | Status: DC | PRN

## 2018-08-01 MED ORDER — ASPIRIN 325 MG PO TBEC: 325.0000 mg | DELAYED_RELEASE_TABLET | Freq: Two times a day (BID) | ORAL | 0 refills | Status: AC

## 2018-08-01 NOTE — Progress Notes (Signed)
   08/01/18 0511  Vitals  BP 106/62  BP Location Left Arm  BP Method Manual  Pulse Rate (!) 50  Pulse Rate Source Left (radial, manual)   RN called Gilmer Mor, RN with rapid response. Orders to continue to monitor. RN will continue to monitor.

## 2018-08-01 NOTE — Progress Notes (Signed)
Physical Therapy Treatment Patient Details Name: John Bowen MRN: [id number] DOB: 12-07-1955 Today's Date: 08/01/2018    History of Present Illness L TKA revision    PT Comments    Pt progressing well, will see again in early pm for HEP review   Follow Up Recommendations  Follow surgeon's recommendation for DC plan and follow-up therapies     Equipment Recommendations  None recommended by PT    Recommendations for Other Services       Precautions / Restrictions Precautions Precautions: Knee Precaution Comments: reviewed no pillow under knee Restrictions Weight Bearing Restrictions: No    Mobility  Bed Mobility Overal bed mobility: Modified Independent                Transfers Overall transfer level: Needs assistance Equipment used: Rolling walker (2 wheeled) Transfers: Sit to/from Stand Sit to Stand: Supervision         General transfer comment: VCs hand placement   Ambulation/Gait Ambulation/Gait assistance: Supervision Gait Distance (Feet): 200 Feet Assistive device: Rolling walker (2 wheeled) Gait Pattern/deviations: Step-to pattern;Decreased stance time - left     General Gait Details: verbal cues for gait progression; amb 15' without AD   Stairs Stairs: Yes Stairs assistance: Min guard;Supervision Stair Management: One rail Right;One rail Left;Step to pattern;Forwards Number of Stairs: 3 General stair comments: cues for sequence   Wheelchair Mobility    Modified Rankin (Stroke Patients Only)       Balance                                            Cognition Arousal/Alertness: Awake/alert Behavior During Therapy: WFL for tasks assessed/performed Overall Cognitive Status: Within Functional Limits for tasks assessed                                        Exercises      General Comments        Pertinent Vitals/Pain Pain Assessment: 0-10 Pain Score: 3  Pain Location: L knee with  flexion Pain Descriptors / Indicators: Sore Pain Intervention(s): Limited activity within patient's tolerance;Monitored during session;Premedicated before session    Home Living                      Prior Function            PT Goals (current goals can now be found in the care plan section) Acute Rehab PT Goals Patient Stated Goal: golf PT Goal Formulation: With patient/family Time For Goal Achievement: 08/07/18 Potential to Achieve Goals: Good Progress towards PT goals: Progressing toward goals    Frequency    7X/week      PT Plan Current plan remains appropriate    Co-evaluation              AM-PAC PT "6 Clicks" Mobility   Outcome Measure  Help needed turning from your back to your side while in a flat bed without using bedrails?: None Help needed moving from lying on your back to sitting on the side of a flat bed without using bedrails?: None Help needed moving to and from a bed to a chair (including a wheelchair)?: A Little Help needed standing up from a chair using your arms (e.g., wheelchair or bedside chair)?: A Little  Help needed to walk in hospital room?: A Little Help needed climbing 3-5 steps with a railing? : A Little 6 Click Score: 20    End of Session Equipment Utilized During Treatment: Gait belt Activity Tolerance: Patient tolerated treatment well Patient left: Other (comment)(in bathroom, NT aware)   PT Visit Diagnosis: Difficulty in walking, not elsewhere classified (R26.2);Pain Pain - Right/Left: Left Pain - part of body: Knee     Time: 0947-1000 PT Time Calculation (min) (ACUTE ONLY): 13 min  Charges:  $Gait Training: 8-22 mins                     Kenyon Ana, PT  Pager: [phone] Acute Rehab Dept [facility]): [phone]   08/01/2018    [facility] 08/01/2018, 10:05 AM

## 2018-08-01 NOTE — Progress Notes (Signed)
   Subjective: 1 Day Post-Op Procedure(s) (LRB): Left knee polyethylene revision (Left) Patient reports pain as mild.   Patient seen in rounds with Dr. Wynelle Link. Patient is well, and has had no acute complaints or problems other than pain in the left knee. Per nursing notes, he had an episode of bradycardia at a rate of 50 this morning. This is not concerning as he has remained asymptomatic and heart rate has been consistently in the 50's. Foley catheter removed, positive flatus.  We will continue therapy today.   Objective: Vital signs in last 24 hours: Temp:  [97.5 F (36.4 C)-98.8 F (37.1 C)] 98 F (36.7 C) (03/05 0455) Pulse Rate:  [50-72] 50 (03/05 0511) Resp:  [5-18] 16 (03/05 0455) BP: (101-147)/(46-84) 106/62 (03/05 0511) SpO2:  [95 %-100 %] 98 % (03/05 0455) Weight:  [86.6 kg] 86.6 kg (03/04 1044)  Intake/Output from previous day:  Intake/Output Summary (Last 24 hours) at 08/01/2018 0757 Last data filed at 08/01/2018 0600 Gross per 24 hour  Intake 5647.18 ml  Output 3790 ml  Net 1857.18 ml     Intake/Output this shift: No intake/output data recorded.  Labs: Recent Labs    08/01/18 0453  HGB 11.5*   Recent Labs    08/01/18 0453  WBC 8.3  RBC 3.92*  HCT 36.9*  PLT 274   Recent Labs    08/01/18 0453  NA 139  K 4.0  CL 112*  CO2 24  BUN 14  CREATININE 0.90  GLUCOSE 130*  CALCIUM 8.1*   No results for input(s): LABPT, INR in the last 72 hours.  Exam: General - Patient is Alert and Oriented Extremity - Neurologically intact Sensation intact distally Intact pulses distally Dorsiflexion/Plantar flexion intact Dressing - dressing C/D/I Motor Function - intact, moving foot and toes well on exam.   Past Medical History:  Diagnosis Date  . Asthma    as a child  . Complication of anesthesia   . DJD (degenerative joint disease)    knees  . Dysfunction of sleep stage or arousal   . Hypertension   . Meningeal disorder   . Meningitis    Zoster  .  PONV (postoperative nausea and vomiting)    vomiting when got home after arthroscopic  knee surgery  . Shingles   . Sleep apnea    does not tolerate cpap    Assessment/Plan: 1 Day Post-Op Procedure(s) (LRB): Left knee polyethylene revision (Left) Principal Problem:   Unstable knee, left Active Problems:   Failed total knee arthroplasty (HCC)  Estimated body mass index is 27.41 kg/m as calculated from the following:   Height as of this encounter: 5\' 10"  (1.778 m).   Weight as of this encounter: 86.6 kg. Advance diet Up with therapy D/C IV fluids   DVT Prophylaxis - Aspirin Weight bearing as tolerated. D/C O2 and pulse ox and try on room air. Hemovac pulled without difficulty, will begin therapy today.  Plan is to go Home after hospital stay. Plan for discharge today as long as he continues to do well with therapy. Scheduled for outpatient PT at [facility]. Per nursing notes, he had an episode of bradycardia at a rate of 50 this morning. This is not concerning as he has remained asymptomatic and heart rate has been consistently in the 50's which appears to be his baseline. Follow up in the office in 2 weeks.   Griffith Citron, PA-C Orthopedic Surgery 08/01/2018, 7:57 AM

## 2018-08-01 NOTE — Progress Notes (Signed)
   08/01/18 1300  PT Visit Information  Last PT Received On 08/01/18  Assistance Needed +1  History of Present Illness L TKA revision  Subjective Data  Patient Stated Goal golf  Precautions  Precautions Knee  Precaution Comments reviewed no pillow under knee  Restrictions  Weight Bearing Restrictions No  Pain Assessment  Pain Assessment 0-10  Pain Score 3  Pain Location L knee with flexion  Pain Descriptors / Indicators Sore  Pain Intervention(s) Limited activity within patient's tolerance;Monitored during session;Ice applied  Cognition  Arousal/Alertness Awake/alert  Behavior During Therapy WFL for tasks assessed/performed  Overall Cognitive Status Within Functional Limits for tasks assessed  Total Joint Exercises  Ankle Circles/Pumps AROM;Both;10 reps;Supine  Quad Sets AROM;Left;5 reps;Supine  Heel Slides AROM;AAROM;10 reps;Supine  Goniometric ROM ~8* to 85* AAROM Left knee flexion  Straight Leg Raises AROM;10 reps;Left  Short Arc Quad AROM;Left;10 reps  PT - End of Session  Activity Tolerance Patient tolerated treatment well  Patient left in bed;with call bell/phone within reach;with bed alarm set  Nurse Communication Mobility status   PT - Assessment/Plan  PT Plan Current plan remains appropriate  PT Visit Diagnosis Difficulty in walking, not elsewhere classified (R26.2);Pain  Pain - Right/Left Left  Pain - part of body Knee  PT Frequency (ACUTE ONLY) 7X/week  Follow Up Recommendations Follow surgeon's recommendation for DC plan and follow-up therapies  PT equipment None recommended by PT  AM-PAC PT "6 Clicks" Mobility Outcome Measure (Version 2)  Help needed turning from your back to your side while in a flat bed without using bedrails? 4  Help needed moving from lying on your back to sitting on the side of a flat bed without using bedrails? 4  Help needed moving to and from a bed to a chair (including a wheelchair)? 3  Help needed standing up from a chair using your  arms (e.g., wheelchair or bedside chair)? 3  Help needed to walk in hospital room? 3  Help needed climbing 3-5 steps with a railing?  3  6 Click Score 20  Consider Recommendation of Discharge To: Home with no services  PT Goal Progression  Progress towards PT goals Progressing toward goals  Acute Rehab PT Goals  PT Goal Formulation With patient/family  Time For Goal Achievement 08/07/18  Potential to Achieve Goals Good  PT Time Calculation  PT Start Time (ACUTE ONLY) 1155  PT Stop Time (ACUTE ONLY) 1217  PT Time Calculation (min) (ACUTE ONLY) 22 min  PT General Charges  $$ ACUTE PT VISIT 1 Visit  PT Treatments  $Therapeutic Exercise 8-22 mins

## 2018-08-02 NOTE — Discharge Summary (Signed)
Physician Discharge Summary   Patient ID: John Bowen MRN: [id number] DOB/AGE: 65-Dec-1957 65 y.o.  Admit date: 07/31/2018 Discharge date: 08/01/2018  Primary Diagnosis: Unstable left total knee arthroplasty  Admission Diagnoses:  Past Medical History:  Diagnosis Date  . Asthma    as a child  . Complication of anesthesia   . DJD (degenerative joint disease)    knees  . Dysfunction of sleep stage or arousal   . Hypertension   . Meningeal disorder   . Meningitis    Zoster  . PONV (postoperative nausea and vomiting)    vomiting when got home after arthroscopic  knee surgery  . Shingles   . Sleep apnea    does not tolerate cpap   Discharge Diagnoses:   Principal Problem:   Unstable knee, left Active Problems:   Failed total knee arthroplasty ([place])  Estimated body mass index is 27.41 kg/m as calculated from the following:   Height as of this encounter: 5\' 10"  (1.778 m).   Weight as of this encounter: 86.6 kg.  Procedure:  Procedure(s) (LRB): Left knee polyethylene revision (Left)   Consults: None  HPI: The patient is a 63 year old male who had a left total knee arthroplasty done approximately 2 years ago.  He has developed instability in the knee and pain and effusion associated with it.  Infection workup was negative with a  negative bone scan.  He presents now for polyethylene versus total knee revision.  Laboratory Data: Admission on 07/31/2018, Discharged on 08/01/2018  Component Date Value Ref Range Status  . WBC 08/01/2018 8.3  4.0 - 10.5 K/uL Final  . RBC 08/01/2018 3.92* 4.22 - 5.81 MIL/uL Final  . Hemoglobin 08/01/2018 11.5* 13.0 - 17.0 g/dL Final  . HCT 08/01/2018 36.9* 39.0 - 52.0 % Final  . MCV 08/01/2018 94.1  80.0 - 100.0 fL Final  . MCH 08/01/2018 29.3  26.0 - 34.0 pg Final  . MCHC 08/01/2018 31.2  30.0 - 36.0 g/dL Final  . RDW 08/01/2018 12.6  11.5 - 15.5 % Final  . Platelets 08/01/2018 274  150 - 400 K/uL Final  . nRBC 08/01/2018 0.0  0.0 - 0.2 %  Final   Performed at [facility], [place] [address] [place], [place] [address]  . Sodium 08/01/2018 139  135 - 145 mmol/L Final  . Potassium 08/01/2018 4.0  3.5 - 5.1 mmol/L Final  . Chloride 08/01/2018 112* 98 - 111 mmol/L Final  . CO2 08/01/2018 24  22 - 32 mmol/L Final  . Glucose, Bld 08/01/2018 130* 70 - 99 mg/dL Final  . BUN 08/01/2018 14  8 - 23 mg/dL Final  . Creatinine, Ser 08/01/2018 0.90  0.61 - 1.24 mg/dL Final  . Calcium 08/01/2018 8.1* 8.9 - 10.3 mg/dL Final  . GFR calc non Af Amer 08/01/2018 >60  >60 mL/min Final  . GFR calc Af Amer 08/01/2018 >60  >60 mL/min Final  . Anion gap 08/01/2018 3* 5 - 15 Final   Performed at [facility], [place] [address] [place], [place] [address]  Hospital Outpatient Visit on 07/24/2018  Component Date Value Ref Range Status  . aPTT 07/24/2018 28  24 - 36 seconds Final   Performed at [facility], [place] [address] [place], [place] [address]  . WBC 07/24/2018 4.1  4.0 - 10.5 K/uL Final  . RBC 07/24/2018 4.58  4.22 - 5.81 MIL/uL Final  . Hemoglobin 07/24/2018 13.8  13.0 - 17.0 g/dL Final  .  HCT 07/24/2018 42.0  39.0 - 52.0 % Final  . MCV 07/24/2018 91.7  80.0 - 100.0 fL Final  . MCH 07/24/2018 30.1  26.0 - 34.0 pg Final  . MCHC 07/24/2018 32.9  30.0 - 36.0 g/dL Final  . RDW 07/24/2018 12.5  11.5 - 15.5 % Final  . Platelets 07/24/2018 306  150 - 400 K/uL Final  . nRBC 07/24/2018 0.0  0.0 - 0.2 % Final   Performed at [facility], [place] [address] [place], [place] [address]  . Sodium 07/24/2018 142  135 - 145 mmol/L Final  . Potassium 07/24/2018 3.9  3.5 - 5.1 mmol/L Final  . Chloride 07/24/2018 110  98 - 111 mmol/L Final  . CO2 07/24/2018 26  22 - 32 mmol/L Final  . Glucose, Bld 07/24/2018 97  70 - 99 mg/dL Final  . BUN 07/24/2018 19  8 - 23 mg/dL Final  . Creatinine, Ser 07/24/2018 0.83  0.61 - 1.24 mg/dL Final  . Calcium 07/24/2018 8.7* 8.9 - 10.3 mg/dL Final  .  Total Protein 07/24/2018 7.2  6.5 - 8.1 g/dL Final  . Albumin 07/24/2018 4.4  3.5 - 5.0 g/dL Final  . AST 07/24/2018 20  15 - 41 U/L Final  . ALT 07/24/2018 21  0 - 44 U/L Final  . Alkaline Phosphatase 07/24/2018 84  38 - 126 U/L Final  . Total Bilirubin 07/24/2018 0.5  0.3 - 1.2 mg/dL Final  . GFR calc non Af Amer 07/24/2018 >60  >60 mL/min Final  . GFR calc Af Amer 07/24/2018 >60  >60 mL/min Final  . Anion gap 07/24/2018 6  5 - 15 Final   Performed at [facility], [place] [address], [place], [place] [address]  . Prothrombin Time 07/24/2018 13.2  11.4 - 15.2 seconds Final  . INR 07/24/2018 1.0  0.8 - 1.2 Final   Comment: (NOTE) INR goal varies based on device and disease states. Performed at [facility], [place] [address], [place], [place] [address]   . ABO/RH(D) 07/24/2018 O NEG   Final  . Antibody Screen 07/24/2018 NEG   Final  . Sample Expiration 07/24/2018 08/03/2018   Final  . Extend sample reason 07/24/2018    Final                   Value:NO TRANSFUSIONS OR PREGNANCY IN THE PAST 3 MONTHS Performed at [facility] [address], [place], [place] [address]   . MRSA, PCR 07/24/2018 NEGATIVE  NEGATIVE Final  . Staphylococcus aureus 07/24/2018 NEGATIVE  NEGATIVE Final   Comment: (NOTE) The Xpert SA Assay (FDA approved for NASAL specimens in patients 64 years of age and older), is one component of a comprehensive surveillance program. It is not intended to diagnose infection nor to guide or monitor treatment. Performed at [facility], [place] [address], [place], [place] [address]   . ABO/RH(D) 07/24/2018    Final                   Value:O NEG Performed at [facility], [place] [address] [place], [place] [address]   Office Visit on 07/15/2018  Component Date Value Ref Range Status  . WBC 07/15/2018 3.4* 4.0 - 10.5 K/uL Final  . RBC 07/15/2018 4.68  4.22 - 5.81 Mil/uL Final  . Platelets  07/15/2018 253.0  150.0 - 400.0 K/uL Final  . Hemoglobin 07/15/2018 14.1  13.0 - 17.0 g/dL Final  .  HCT 07/15/2018 42.2  39.0 - 52.0 % Final  . MCV 07/15/2018 90.2  78.0 - 100.0 fl Final  . MCHC 07/15/2018 33.3  30.0 - 36.0 g/dL Final  . RDW 07/15/2018 13.4  11.5 - 15.5 % Final  . Sodium 07/15/2018 144  135 - 145 mEq/L Final  . Potassium 07/15/2018 4.6  3.5 - 5.1 mEq/L Final  . Chloride 07/15/2018 106  96 - 112 mEq/L Final  . CO2 07/15/2018 31  19 - 32 mEq/L Final  . Glucose, Bld 07/15/2018 81  70 - 99 mg/dL Final  . BUN 07/15/2018 15  6 - 23 mg/dL Final  . Creatinine, Ser 07/15/2018 0.95  0.40 - 1.50 mg/dL Final  . Total Bilirubin 07/15/2018 0.5  0.2 - 1.2 mg/dL Final  . Alkaline Phosphatase 07/15/2018 85  39 - 117 U/L Final  . AST 07/15/2018 16  0 - 37 U/L Final  . ALT 07/15/2018 19  0 - 53 U/L Final  . Total Protein 07/15/2018 6.7  6.0 - 8.3 g/dL Final  . Albumin 07/15/2018 4.4  3.5 - 5.2 g/dL Final  . Calcium 07/15/2018 9.3  8.4 - 10.5 mg/dL Final  . GFR 07/15/2018 79.56  >60.00 mL/min Final  . Hgb A1c MFr Bld 07/15/2018 5.3  4.6 - 6.5 % Final   Glycemic Control Guidelines for People with Diabetes:Non Diabetic:  <6%Goal of Therapy: <7%Additional Action Suggested:  >8%   . Cholesterol 07/15/2018 170  0 - 200 mg/dL Final   ATP III Classification       Desirable:  < 200 mg/dL               Borderline High:  200 - 239 mg/dL          High:  > = 240 mg/dL  . Triglycerides 07/15/2018 108.0  0.0 - 149.0 mg/dL Final   Normal:  <150 mg/dLBorderline High:  150 - 199 mg/dL  . HDL 07/15/2018 38.70* >39.00 mg/dL Final  . VLDL 07/15/2018 21.6  0.0 - 40.0 mg/dL Final  . LDL Cholesterol 07/15/2018 110* 0 - 99 mg/dL Final  . Total CHOL/HDL Ratio 07/15/2018 4   Final                  Men          Women1/2 Average Risk     3.4          3.3Average Risk          5.0          4.42X Average Risk          9.6          7.13X Average Risk          15.0          11.0                      . NonHDL 07/15/2018  131.33   Final   NOTE:  Non-HDL goal should be 30 mg/dL higher than patient's LDL goal (i.e. LDL goal of < 70 mg/dL, would have non-HDL goal of < 100 mg/dL)  . TSH 07/15/2018 0.74  0.35 - 4.50 uIU/mL Final  . PSA 07/15/2018 1.05  0.10 - 4.00 ng/ml Final   Test performed using [employer] PSA Assay, a parmagnetic partical, chemiluminecent immunoassay.  . Color, UA 07/15/2018 Yellow   Final  . Clarity, UA 07/15/2018 Clear   Final  . Glucose, UA 07/15/2018 Negative  Negative Final  . Bilirubin, UA 07/15/2018 N   Final  . Ketones, UA 07/15/2018 N   Final  . Spec Grav, UA 07/15/2018 >=1.030* 1.010 - 1.025 Final  . Blood, UA 07/15/2018 N   Final  . pH, UA 07/15/2018 6.0  5.0 - 8.0 Final  . Protein, UA 07/15/2018 Negative  Negative Final  . Urobilinogen, UA 07/15/2018 0.2  0.2 or 1.0 E.U./dL Final  . Nitrite, UA 07/15/2018 N   Final  . Leukocytes, UA 07/15/2018 Negative  Negative Final     X-Rays:No results found.  EKG: Orders placed or performed in visit on 07/15/18  . EKG 12-Lead     Hospital Course: John Bowen is a 63 y.o. who was admitted to [facility] They were brought to the operating room on 07/31/2018 and underwent Procedure(s): Left knee polyethylene revision.  Patient tolerated the procedure well and was later transferred to the recovery room and then to the orthopaedic floor for postoperative care. They were given PO and IV analgesics for pain control following their surgery. They were given 24 hours of postoperative antibiotics of  Anti-infectives (From admission, onward)   Start     Dose/Rate Route Frequency Ordered Stop   07/31/18 1830  ceFAZolin (ANCEF) IVPB 2g/100 mL premix     2 g 200 mL/hr over 30 Minutes Intravenous Every 6 hours 07/31/18 1536 08/01/18 0120   07/31/18 1045  ceFAZolin (ANCEF) IVPB 2g/100 mL premix     2 g 200 mL/hr over 30 Minutes Intravenous On call to O.R. 07/31/18 1043 07/31/18 1250     and started on DVT prophylaxis in the form  of Aspirin.   PT and OT were ordered for total joint protocol. Discharge planning consulted to help with postop disposition and equipment needs.  Patient had a good night on the evening of surgery. They started to get up OOB with therapy on POD #0. Per nursing, on the morning of POD #1, he had an episode of asymptomatic bradycardia at a rate of 50. We were not concerned about this as his heart rate remained consistently in the 50's and he was not symptomatic. Pt was seen during rounds and was ready to go home pending progress with therapy. Hemovac drain was pulled without difficulty. He worked with therapy on POD #1 and was meeting his goals. Pt was discharged to home later that day in stable condition.  Diet: Regular diet Activity: WBAT Follow-up: in 2 weeks Disposition: Home Discharged Condition: good   Discharge Instructions    Call MD / Call 911   Complete by:  As directed    If you experience chest pain or shortness of breath, CALL 911 and be transported to the hospital emergency room.  If you develope a fever above 101 F, pus (white drainage) or increased drainage or redness at the wound, or calf pain, call your surgeon's office.   Change dressing   Complete by:  As directed    Change dressing on Friday, then change the dressing daily with sterile 4 x 4 inch gauze dressing and apply TED hose.   Constipation Prevention   Complete by:  As directed    Drink plenty of fluids.  Prune juice may be helpful.  You may use a stool softener, such as Colace (over the counter) 100 mg twice a day.  Use MiraLax (over the counter) for constipation as needed.   Diet - low sodium heart healthy   Complete by:  As directed  Discharge instructions   Complete by:  As directed    Dr. Gaynelle Arabian Total Joint Specialist [facility] [address] [place], [place] [address] [phone]  POLYETHYLENE REVISION POSTOPERATIVE DIRECTIONS  Knee Rehabilitation, Guidelines Following Surgery    Results after knee surgery are often greatly improved when you follow the exercise, range of motion and muscle strengthening exercises prescribed by your doctor. Safety measures are also important to protect the knee from further injury. Any time any of these exercises cause you to have increased pain or swelling in your knee joint, decrease the amount until you are comfortable again and slowly increase them. If you have problems or questions, call your caregiver or physical therapist for advice.   HOME CARE INSTRUCTIONS  Remove items at home which could result in a fall. This includes throw rugs or furniture in walking pathways.  ICE to the affected knee every three hours for 30 minutes at a time and then as needed for pain and swelling.  Continue to use ice on the knee for pain and swelling from surgery. You may notice swelling that will progress down to the foot and ankle.  This is normal after surgery.  Elevate the leg when you are not up walking on it.   Continue to use the breathing machine which will help keep your temperature down.  It is common for your temperature to cycle up and down following surgery, especially at night when you are not up moving around and exerting yourself.  The breathing machine keeps your lungs expanded and your temperature down. Do not place pillow under knee, focus on keeping the knee straight while resting   DIET You may resume your previous home diet once your are discharged from the hospital.  DRESSING / WOUND CARE / SHOWERING You may change your dressing 3-5 days after surgery.  Then change the dressing every day with sterile gauze.  Please use good hand washing techniques before changing the dressing.  Do not use any lotions or creams on the incision until instructed by your surgeon. You may start showering once you are discharged home but do not submerge the incision under water. Just pat the incision dry and apply a dry gauze dressing on daily. Change the  surgical dressing daily and reapply a dry dressing each time.  ACTIVITY Walk with your walker as instructed. Use walker as long as suggested by your caregivers. Avoid periods of inactivity such as sitting longer than an hour when not asleep. This helps prevent blood clots.  You may resume a sexual relationship in one month or when given the OK by your doctor.  You may return to work once you are cleared by your doctor.  Do not drive a car for 6 weeks or until released by you surgeon.  Do not drive while taking narcotics.  WEIGHT BEARING Weight bearing as tolerated with assist device (walker, cane, etc) as directed, use it as long as suggested by your surgeon or therapist, typically at least 4-6 weeks.  POSTOPERATIVE CONSTIPATION PROTOCOL Constipation - defined medically as fewer than three stools per week and severe constipation as less than one stool per week.  One of the most common issues patients have following surgery is constipation.  Even if you have a regular bowel pattern at home, your normal regimen is likely to be disrupted due to multiple reasons following surgery.  Combination of anesthesia, postoperative narcotics, change in appetite and fluid intake all can affect your bowels.  In  order to avoid complications following surgery, here are some recommendations in order to help you during your recovery period.  Colace (docusate) - Pick up an over-the-counter form of Colace or another stool softener and take twice a day as long as you are requiring postoperative pain medications.  Take with a full glass of water daily.  If you experience loose stools or diarrhea, hold the colace until you stool forms back up.  If your symptoms do not get better within 1 week or if they get worse, check with your doctor.  Dulcolax (bisacodyl) - Pick up over-the-counter and take as directed by the product packaging as needed to assist with the movement of your bowels.  Take with a full glass of water.   Use this product as needed if not relieved by Colace only.   MiraLax (polyethylene glycol) - Pick up over-the-counter to have on hand.  MiraLax is a solution that will increase the amount of water in your bowels to assist with bowel movements.  Take as directed and can mix with a glass of water, juice, soda, coffee, or tea.  Take if you go more than two days without a movement. Do not use MiraLax more than once per day. Call your doctor if you are still constipated or irregular after using this medication for 7 days in a row.  If you continue to have problems with postoperative constipation, please contact the office for further assistance and recommendations.  If you experience "the worst abdominal pain ever" or develop nausea or vomiting, please contact the office immediatly for further recommendations for treatment.  ITCHING  If you experience itching with your medications, try taking only a single pain pill, or even half a pain pill at a time.  You can also use Benadryl over the counter for itching or also to help with sleep.   TED HOSE STOCKINGS Wear the elastic stockings on both legs for three weeks following surgery during the day but you may remove then at night for sleeping.  MEDICATIONS See your medication summary on the "After Visit Summary" that the nursing staff will review with you prior to discharge.  You may have some home medications which will be placed on hold until you complete the course of blood thinner medication.  It is important for you to complete the blood thinner medication as prescribed by your surgeon.  Continue your approved medications as instructed at time of discharge.  PRECAUTIONS If you experience chest pain or shortness of breath - call 911 immediately for transfer to the hospital emergency department.  If you develop a fever greater that 101 F, purulent drainage from wound, increased redness or drainage from wound, foul odor from the wound/dressing, or calf  pain - CONTACT YOUR SURGEON.                                                   FOLLOW-UP APPOINTMENTS Make sure you keep all of your appointments after your operation with your surgeon and caregivers. You should call the office at the above phone number and make an appointment for approximately two weeks after the date of your surgery or on the date instructed by your surgeon outlined in the "After Visit Summary".   RANGE OF MOTION AND STRENGTHENING EXERCISES  Rehabilitation of the knee is important following a knee injury or an  operation. After just a few days of immobilization, the muscles of the thigh which control the knee become weakened and shrink (atrophy). Knee exercises are designed to build up the tone and strength of the thigh muscles and to improve knee motion. Often times heat used for twenty to thirty minutes before working out will loosen up your tissues and help with improving the range of motion but do not use heat for the first two weeks following surgery. These exercises can be done on a training (exercise) mat, on the floor, on a table or on a bed. Use what ever works the best and is most comfortable for you Knee exercises include:  Leg Lifts - While your knee is still immobilized in a splint or cast, you can do straight leg raises. Lift the leg to 60 degrees, hold for 3 sec, and slowly lower the leg. Repeat 10-20 times 2-3 times daily. Perform this exercise against resistance later as your knee gets better.  Quad and Hamstring Sets - Tighten up the muscle on the front of the thigh (Quad) and hold for 5-10 sec. Repeat this 10-20 times hourly. Hamstring sets are done by pushing the foot backward against an object and holding for 5-10 sec. Repeat as with quad sets.  Leg Slides: Lying on your back, slowly slide your foot toward your buttocks, bending your knee up off the floor (only go as far as is comfortable). Then slowly slide your foot back down until your leg is flat on the floor  again. Angel Wings: Lying on your back spread your legs to the side as far apart as you can without causing discomfort.  A rehabilitation program following serious knee injuries can speed recovery and prevent re-injury in the future due to weakened muscles. Contact your doctor or a physical therapist for more information on knee rehabilitation.   IF YOU ARE TRANSFERRED TO A SKILLED REHAB FACILITY If the patient is transferred to a skilled rehab facility following release from the hospital, a list of the current medications will be sent to the facility for the patient to continue.  When discharged from the skilled rehab facility, please have the facility set up the patient's [place] prior to being released. Also, the skilled facility will be responsible for providing the patient with their medications at time of release from the facility to include their pain medication, the muscle relaxants, and their blood thinner medication. If the patient is still at the rehab facility at time of the two week follow up appointment, the skilled rehab facility will also need to assist the patient in arranging follow up appointment in our office and any transportation needs.  MAKE SURE YOU:  Understand these instructions.  Get help right away if you are not doing well or get worse.    Pick up stool softner and laxative for home use following surgery while on pain medications. Do not submerge incision under water. Please use good hand washing techniques while changing dressing each day. May shower starting three days after surgery. Please use a clean towel to pat the incision dry following showers. Continue to use ice for pain and swelling after surgery. Do not use any lotions or creams on the incision until instructed by your surgeon.   Do not put a pillow under the knee. Place it under the heel.   Complete by:  As directed    Driving restrictions   Complete by:  As directed    No driving  for  two weeks   TED hose   Complete by:  As directed    Use stockings (TED hose) for three weeks on both leg(s).  You may remove them at night for sleeping.   Weight bearing as tolerated   Complete by:  As directed      Allergies as of 08/01/2018   No Known Allergies     Medication List    TAKE these medications   aspirin 325 MG EC tablet Take 1 tablet (325 mg total) by mouth 2 (two) times daily for 20 days. Then take one baby Aspirin (81 mg) once a day for three weeks.Then discontinue aspirin.   atorvastatin 40 MG tablet Commonly known as:  LIPITOR Take 1 tablet (40 mg total) by mouth daily.   gabapentin 300 MG capsule Commonly known as:  NEURONTIN Take 1 capsule (300 mg total) by mouth 3 (three) times daily. Take a 300 mg capsule three times a day for two weeks following surgery.Then take a 300 mg capsule two times a day for two weeks.Then take a 300 mg capsule once a day for two weeks.   hydrocortisone 2.5 % cream Apply 1 application topically daily as needed (itching).   lisinopril 40 MG tablet Commonly known as:  PRINIVIL,ZESTRIL TAKE 1 TABLET BY MOUTH ONCE DAILY   methocarbamol 500 MG tablet Commonly known as:  ROBAXIN Take 1 tablet (500 mg total) by mouth every 6 (six) hours as needed for muscle spasms.   oxyCODONE 5 MG immediate release tablet Commonly known as:  Oxy IR/ROXICODONE Take 1-2 tablets (5-10 mg total) by mouth every 6 (six) hours as needed for moderate pain (pain score 4-6).   traZODone 50 MG tablet Commonly known as:  DESYREL TAKE 1/2 TO 1 (ONE-HALF TO ONE) TABLET BY MOUTH AT BEDTIME AS NEEDED FOR SLEEP What changed:    how much to take  how to take this  when to take this  additional instructions            Discharge Care Instructions  (From admission, onward)         Start     Ordered   08/01/18 0000  Weight bearing as tolerated     08/01/18 0813   08/01/18 0000  Change dressing    Comments:  Change dressing on Friday, then  change the dressing daily with sterile 4 x 4 inch gauze dressing and apply TED hose.   08/01/18 0813         Follow-up Information    Gaynelle Arabian, MD. Schedule an appointment as soon as possible for a visit on 08/15/2018.   Specialty:  Orthopedic Surgery Contact information: [address] [place] [address] [phone]           Signed: Griffith Citron, PA-C Orthopedic Surgery 08/02/2018, 2:27 PM

## 2018-08-05 ENCOUNTER — Other Ambulatory Visit: Payer: Self-pay | Admitting: Family Medicine

## 2018-08-05 DIAGNOSIS — M25562 Pain in left knee: Secondary | ICD-10-CM | POA: Diagnosis not present

## 2018-08-05 NOTE — Telephone Encounter (Signed)
Copied from [place] [id number]. Topic: Quick Communication - Rx Refill/Question >> Aug 05, 2018  3:13 PM [place], [place] D wrote: Medication: traZODone (DESYREL) 50 MG tablet / Pt stated at his last OV with Dr. Jerline Pain he was instructed to increase the dosage on his Trazadone which has caused him to run out. He is requesting refill. Please advise.  Has the patient contacted their pharmacy? Yes.   (Agent: If no, request that the patient contact the pharmacy for the refill.) (Agent: If yes, when and what did the pharmacy advise?)  Preferred Pharmacy (with phone number or street name): [place], [place] - [address] [phone] (Phone) [phone] (Fax)    Agent: Please be advised that RX refills may take up to 3 business days. We ask that you follow-up with your pharmacy.

## 2018-08-05 NOTE — Telephone Encounter (Signed)
See note

## 2018-08-05 NOTE — Telephone Encounter (Signed)
Requested medication (s) are due for refill today:   Yes   Ran out because dose was increased from 50 mg to 100 mg by Dr. Jerline Pain.  Requested medication (s) are on the active medication list:   Yes  Future visit scheduled:      Last ordered: 01/18/18  #90  0 refills.  LOV 07/15/2018   Requested Prescriptions  Pending Prescriptions Disp Refills   traZODone (DESYREL) 50 MG tablet 90 tablet 0    Sig: Take 2 tablets (100 mg total) by mouth at bedtime.     Psychiatry: Antidepressants - Serotonin Modulator Passed - 08/05/2018  3:24 PM      Passed - Valid encounter within last 6 months    Recent Outpatient Visits          3 weeks ago Encounter for general adult medical examination with abnormal findings   [facility], Algis Greenhouse, MD   1 year ago [place] [facility], [place], DO   2 years ago Essential hypertension   Therapist, music at [facility], Jory Ee, MD   2 years ago Essential hypertension   Therapist, music at [facility], Jory Ee, MD   2 years ago Cough   Therapist, music at [facility], [place], [place]

## 2018-08-07 DIAGNOSIS — M25562 Pain in left knee: Secondary | ICD-10-CM | POA: Diagnosis not present

## 2018-08-07 MED ORDER — TRAZODONE HCL 50 MG PO TABS: 100.0000 mg | ORAL_TABLET | Freq: Every day | ORAL | 0 refills | Status: DC

## 2018-08-13 DIAGNOSIS — M25562 Pain in left knee: Secondary | ICD-10-CM | POA: Diagnosis not present

## 2018-08-16 DIAGNOSIS — M25562 Pain in left knee: Secondary | ICD-10-CM | POA: Diagnosis not present

## 2018-08-20 DIAGNOSIS — M25562 Pain in left knee: Secondary | ICD-10-CM | POA: Diagnosis not present

## 2018-09-10 DIAGNOSIS — Z471 Aftercare following joint replacement surgery: Secondary | ICD-10-CM | POA: Diagnosis not present

## 2018-09-10 DIAGNOSIS — Z96652 Presence of left artificial knee joint: Secondary | ICD-10-CM | POA: Diagnosis not present

## 2018-09-30 ENCOUNTER — Ambulatory Visit (INDEPENDENT_AMBULATORY_CARE_PROVIDER_SITE_OTHER): Payer: Medicare HMO | Admitting: Family Medicine

## 2018-09-30 ENCOUNTER — Encounter: Payer: Self-pay | Admitting: Family Medicine

## 2018-09-30 VITALS — BP 138/79 | HR 54

## 2018-09-30 DIAGNOSIS — R42 Dizziness and giddiness: Secondary | ICD-10-CM | POA: Diagnosis not present

## 2018-09-30 DIAGNOSIS — E785 Hyperlipidemia, unspecified: Secondary | ICD-10-CM

## 2018-09-30 DIAGNOSIS — G4700 Insomnia, unspecified: Secondary | ICD-10-CM | POA: Diagnosis not present

## 2018-09-30 NOTE — Assessment & Plan Note (Signed)
Patient is having some brain fog.  Thinks that it could be due to his statin.  Will stop atorvastatin for a few weeks to see if symptoms improve.

## 2018-09-30 NOTE — Assessment & Plan Note (Signed)
Continue trazodone 100 mg daily.  If continues to have brain fog issues will try alternatives.

## 2018-09-30 NOTE — Progress Notes (Signed)
    Chief Complaint:  John Bowen is a 63 y.o. male who presents today for a virtual office visit with a chief complaint of vertigo.   Assessment/Plan:  Vertigo History consistent with BPPV. No red flags - normal neurologic exam via video application. 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  Offered meclizine and antiemetics however patient declined.  Discussed reasons to return to care and seek emergent care.  Insomnia Continue trazodone 100 mg daily.  If continues to have brain fog issues will try alternatives.  Dyslipidemia Patient is having some brain fog.  Thinks that it could be due to his statin.  Will stop atorvastatin for a few weeks to see if symptoms improve.     Subjective:  HPI:  Vertigo Symptoms started about 3 days ago. Stable over that time. No obvious precipitating events. No symptoms at rest. Describes a "room spinning" sensation when he turns his head quickly or when he stands up or bends over. No weakness or numbness. He had slight nausea but no vomiting. No other obvious alleviating or aggravating factors.  He started lipitor about 2 months ago and thinks that it could potentially be causing "brain fog." Otherwise is tolerating well.  ROS: Per HPI  PMH: He reports that he has never smoked. He has never used smokeless tobacco. He reports current alcohol use of about 3.0 standard drinks of alcohol per week. He reports that he does not use drugs.      Objective/Observations  Physical Exam: Gen: NAD, resting comfortably Pulm: Normal work of breathing Neuro: Grossly normal, moves all extremities. CN2-12 intact. Rapid alternating movements intact in upper and lower extremities.  Psych: Normal affect and thought content  Virtual Visit via Video   I connected with John Bowen on 09/30/18 at  2:40 PM EDT by a video enabled telemedicine application and verified that I am speaking with the correct person using two identifiers. I discussed the limitations  of evaluation and management by telemedicine and the availability of in person appointments. The patient expressed understanding and agreed to proceed.   Patient location: Home Provider location: [place] participating in the virtual visit: Myself and Patient     Algis Greenhouse. Jerline Pain, MD 09/30/2018 4:01 PM

## 2018-11-07 ENCOUNTER — Other Ambulatory Visit: Payer: Self-pay | Admitting: Family Medicine

## 2019-01-02 ENCOUNTER — Other Ambulatory Visit: Payer: Self-pay

## 2019-01-02 MED ORDER — LISINOPRIL 40 MG PO TABS: 40.0000 mg | ORAL_TABLET | Freq: Every day | ORAL | 4 refills | Status: DC

## 2019-01-31 ENCOUNTER — Other Ambulatory Visit: Payer: Self-pay | Admitting: Family Medicine

## 2019-03-20 DIAGNOSIS — R69 Illness, unspecified: Secondary | ICD-10-CM | POA: Diagnosis not present

## 2019-05-08 ENCOUNTER — Other Ambulatory Visit: Payer: Self-pay | Admitting: Family Medicine

## 2019-05-10 ENCOUNTER — Other Ambulatory Visit: Payer: Self-pay | Admitting: Family Medicine

## 2019-05-12 MED ORDER — TRAZODONE HCL 50 MG PO TABS: 100.0000 mg | ORAL_TABLET | Freq: Every day | ORAL | 0 refills | Status: DC

## 2019-05-12 NOTE — Telephone Encounter (Signed)
Dr. Jerline Pain, please resend Trazodone. Rx failed.

## 2019-05-12 NOTE — Telephone Encounter (Signed)
Rx request,is this ok to fill

## 2019-05-12 NOTE — Addendum Note (Signed)
Addended by: Marian Sorrow on: 05/12/2019 01:11 PM   Modules accepted: Orders

## 2019-08-08 ENCOUNTER — Ambulatory Visit (INDEPENDENT_AMBULATORY_CARE_PROVIDER_SITE_OTHER): Payer: Medicare HMO

## 2019-08-08 ENCOUNTER — Other Ambulatory Visit: Payer: Self-pay

## 2019-08-08 DIAGNOSIS — Z0000 Encounter for general adult medical examination without abnormal findings: Secondary | ICD-10-CM | POA: Diagnosis not present

## 2019-08-08 NOTE — Patient Instructions (Signed)
Mr. John Bowen , Thank you for taking time to come for your Medicare Wellness Visit. I appreciate your ongoing commitment to your health goals. Please review the following plan we discussed and let me know if I can assist you in the future.   Screening recommendations/referrals: Colorectal Screening: Cologuard information provided   Vision and Dental Exams: Recommended annual ophthalmology exams for early detection of glaucoma and other disorders of the eye Recommended annual dental exams for proper oral hygiene  Vaccinations: Influenza vaccine: completed 03/20/19 Pneumococcal vaccine: recommended  Tdap vaccine: up to date; last 08/01/16 Shingles vaccine:  You may receive this vaccine at your local pharmacy. (see handout)   Advanced directives: Advance directives discussed with you today. I have provided a copy for you to complete at home and have notarized. Once this is complete please bring a copy in to our office so we can scan it into your chart.  Goals: Recommend to drink at least 6-8 8oz glasses of water per day and consume a balanced diet rich in fresh fruits and vegetables.   Next appointment: Please schedule your Annual Wellness Visit with your Nurse Health Advisor in one year.  Preventive Care 66 Years and Older, Male Preventive care refers to lifestyle choices and visits with your health care provider that can promote health and wellness. What does preventive care include?  A yearly physical exam. This is also called an annual well check.  Dental exams once or twice a year.  Routine eye exams. Ask your health care provider how often you should have your eyes checked.  Personal lifestyle choices, including:  Daily care of your teeth and gums.  Regular physical activity.  Eating a healthy diet.  Avoiding tobacco and drug use.  Limiting alcohol use.  Practicing safe sex.  Taking low doses of aspirin every day if recommended by your health care provider..  Taking  vitamin and mineral supplements as recommended by your health care provider. What happens during an annual well check? The services and screenings done by your health care provider during your annual well check will depend on your age, overall health, lifestyle risk factors, and family history of disease. Counseling  Your health care provider may ask you questions about your:  Alcohol use.  Tobacco use.  Drug use.  Emotional well-being.  Home and relationship well-being.  Sexual activity.  Eating habits.  History of falls.  Memory and ability to understand (cognition).  Work and work Statistician. Screening  You may have the following tests or measurements:  Height, weight, and BMI.  Blood pressure.  Lipid and cholesterol levels. These may be checked every 5 years, or more frequently if you are over 66 years old.  Skin check.  Lung cancer screening. You may have this screening every year starting at age 66 if you have a 30-pack-year history of smoking and currently smoke or have quit within the past 15 years.  Fecal occult blood test (FOBT) of the stool. You may have this test every year starting at age 66.  Flexible sigmoidoscopy or colonoscopy. You may have a sigmoidoscopy every 5 years or a colonoscopy every 10 years starting at age 66.  Prostate cancer screening. Recommendations will vary depending on your family history and other risks.  Hepatitis C blood test.  Hepatitis B blood test.  Sexually transmitted disease (STD) testing.  Diabetes screening. This is done by checking your blood sugar (glucose) after you have not eaten for a while (fasting). You may have this done every  1-3 years.  Abdominal aortic aneurysm (AAA) screening. You may need this if you are a current or former smoker.  Osteoporosis. You may be screened starting at age 66 if you are at high risk. Talk with your health care provider about your test results, treatment options, and if  necessary, the need for more tests. Vaccines  Your health care provider may recommend certain vaccines, such as:  Influenza vaccine. This is recommended every year.  Tetanus, diphtheria, and acellular pertussis (Tdap, Td) vaccine. You may need a Td booster every 10 years.  Zoster vaccine. You may need this after age 66.  Pneumococcal 13-valent conjugate (PCV13) vaccine. One dose is recommended after age 30.  Pneumococcal polysaccharide (PPSV23) vaccine. One dose is recommended after age 66. Talk to your health care provider about which screenings and vaccines you need and how often you need them. This information is not intended to replace advice given to you by your health care provider. Make sure you discuss any questions you have with your health care provider. Document Released: 06/11/2015 Document Revised: 02/02/2016 Document Reviewed: 03/16/2015 Elsevier Interactive Patient Education  2017 [employer] Prevention in the Home Falls can cause injuries. They can happen to people of all ages. There are many things you can do to make your home safe and to help prevent falls. What can I do on the outside of my home?  Regularly fix the edges of walkways and driveways and fix any cracks.  Remove anything that might make you trip as you walk through a door, such as a raised step or threshold.  Trim any bushes or trees on the path to your home.  Use bright outdoor lighting.  Clear any walking paths of anything that might make someone trip, such as rocks or tools.  Regularly check to see if handrails are loose or broken. Make sure that both sides of any steps have handrails.  Any raised decks and porches should have guardrails on the edges.  Have any leaves, snow, or ice cleared regularly.  Use sand or salt on walking paths during winter.  Clean up any spills in your garage right away. This includes oil or grease spills. What can I do in the bathroom?  Use night  lights.  Install grab bars by the toilet and in the tub and shower. Do not use towel bars as grab bars.  Use non-skid mats or decals in the tub or shower.  If you need to sit down in the shower, use a plastic, non-slip stool.  Keep the floor dry. Clean up any water that spills on the floor as soon as it happens.  Remove soap buildup in the tub or shower regularly.  Attach bath mats securely with double-sided non-slip rug tape.  Do not have throw rugs and other things on the floor that can make you trip. What can I do in the bedroom?  Use night lights.  Make sure that you have a light by your bed that is easy to reach.  Do not use any sheets or blankets that are too big for your bed. They should not hang down onto the floor.  Have a firm chair that has side arms. You can use this for support while you get dressed.  Do not have throw rugs and other things on the floor that can make you trip. What can I do in the kitchen?  Clean up any spills right away.  Avoid walking on wet floors.  Keep items  that you use a lot in easy-to-reach places.  If you need to reach something above you, use a strong step stool that has a grab bar.  Keep electrical cords out of the way.  Do not use floor polish or wax that makes floors slippery. If you must use wax, use non-skid floor wax.  Do not have throw rugs and other things on the floor that can make you trip. What can I do with my stairs?  Do not leave any items on the stairs.  Make sure that there are handrails on both sides of the stairs and use them. Fix handrails that are broken or loose. Make sure that handrails are as long as the stairways.  Check any carpeting to make sure that it is firmly attached to the stairs. Fix any carpet that is loose or worn.  Avoid having throw rugs at the top or bottom of the stairs. If you do have throw rugs, attach them to the floor with carpet tape.  Make sure that you have a light switch at the  top of the stairs and the bottom of the stairs. If you do not have them, ask someone to add them for you. What else can I do to help prevent falls?  Wear shoes that:  Do not have high heels.  Have rubber bottoms.  Are comfortable and fit you well.  Are closed at the toe. Do not wear sandals.  If you use a stepladder:  Make sure that it is fully opened. Do not climb a closed stepladder.  Make sure that both sides of the stepladder are locked into place.  Ask someone to hold it for you, if possible.  Clearly mark and make sure that you can see:  Any grab bars or handrails.  First and last steps.  Where the edge of each step is.  Use tools that help you move around (mobility aids) if they are needed. These include:  Canes.  Walkers.  Scooters.  Crutches.  Turn on the lights when you go into a dark area. Replace any light bulbs as soon as they burn out.  Set up your furniture so you have a clear path. Avoid moving your furniture around.  If any of your floors are uneven, fix them.  If there are any pets around you, be aware of where they are.  Review your medicines with your doctor. Some medicines can make you feel dizzy. This can increase your chance of falling. Ask your doctor what other things that you can do to help prevent falls. This information is not intended to replace advice given to you by your health care provider. Make sure you discuss any questions you have with your health care provider. Document Released: 03/11/2009 Document Revised: 10/21/2015 Document Reviewed: 06/19/2014 Elsevier Interactive Patient Education  2017 [employer].

## 2019-08-08 NOTE — Progress Notes (Signed)
This visit is being conducted via phone call due to the COVID-19 pandemic. This patient has given me verbal consent via phone to conduct this visit, patient states they are participating from their home address. Some vital signs may be absent or patient reported.   Patient identification: identified by name, DOB, and current address.  Location provider: [facility], Office Persons participating in the virtual visit: John Bowen and John Bowen, patient, and John Bowen   Subjective:   John Bowen is a 64 y.o. male who presents for an Initial Medicare Annual Wellness Visit.  Review of Systems   Cardiac Risk Factors include: advanced age (>3men, >38 women);dyslipidemia;hypertension;male gender   Objective:    There were no vitals filed for this visit. There is no height or weight on file to calculate BMI.  Advanced Directives 08/08/2019 07/31/2018 07/24/2018  Does Patient Have a Medical Advance Directive? No No No  Would patient like information on creating a medical advance directive? Yes (MAU/Ambulatory/Procedural Areas - Information given) No - Patient declined No - Patient declined    Current Medications (verified) Outpatient Encounter Medications as of 08/08/2019  Medication Sig  . atorvastatin (LIPITOR) 40 MG tablet Take 1 tablet (40 mg total) by mouth daily.  . hydrocortisone 2.5 % cream Apply 1 application topically daily as needed (itching).  Marland Kitchen lisinopril (ZESTRIL) 40 MG tablet Take 1 tablet (40 mg total) by mouth daily.  . traZODone (DESYREL) 50 MG tablet Take 2 tablets (100 mg total) by mouth at bedtime.   No facility-administered encounter medications on file as of 08/08/2019.    Allergies (verified) Patient has no known allergies.   History: Past Medical History:  Diagnosis Date  . Asthma    as a child  . Complication of anesthesia   . DJD (degenerative joint disease)    knees  . Dysfunction of sleep stage or arousal   . Hypertension    . Meningeal disorder   . Meningitis    Zoster  . PONV (postoperative nausea and vomiting)    vomiting when got home after arthroscopic  knee surgery  . Shingles   . Sleep apnea    does not tolerate cpap   Past Surgical History:  Procedure Laterality Date  . arthroscopic knee     right  . FRACTURE SURGERY       Left wrist and right hand  . JOINT REPLACEMENT     left knee 1979 , left knee 2018  . TOTAL KNEE REVISION Left 07/31/2018   Procedure: Left knee polyethylene revision;  Surgeon: John Arabian, MD;  Location: [facility];  Service: Orthopedics;  Laterality: Left;  37min   Family History  Problem Relation Age of Onset  . Hypertension Other    Social History   Socioeconomic History  . Marital status: Married    Spouse name: Not on file  . Number of children: 1  . Years of education: Not on file  . Highest education level: Not on file  Occupational History  . Occupation: Retired   Tobacco Use  . Smoking status: Never Smoker  . Smokeless tobacco: Never Used  Substance and Sexual Activity  . Alcohol use: Yes    Alcohol/week: 3.0 standard drinks    Types: 3 Cans of beer per week    Comment: a week  . Drug use: No  . Sexual activity: Yes  Other Topics Concern  . Not on file  Social History Narrative   1 Son  Lives with spouse    Social Determinants of Health   Financial Resource Strain:   . Difficulty of Paying Living Expenses:   Food Insecurity:   . Worried About Charity fundraiser in the Last Year:   . Arboriculturist in the Last Year:   Transportation Needs:   . Film/video editor (Medical):   Marland Kitchen Lack of Transportation (Non-Medical):   Physical Activity:   . Days of Exercise per Week:   . Minutes of Exercise per Session:   Stress:   . Feeling of Stress :   Social Connections:   . Frequency of Communication with Friends and Family:   . Frequency of Social Gatherings with Friends and Family:   . Attends Religious Services:   . Active Member of  Clubs or Organizations:   . Attends Archivist Meetings:   Marland Kitchen Marital Status:    Tobacco Counseling Counseling given: Not Answered   Clinical Intake:  Pre-visit preparation completed: Yes  Pain : No/denies pain  Diabetes: No  How often do you need to have someone help you when you read instructions, pamphlets, or other written materials from your doctor or pharmacy?: 1 - Never  Interpreter Needed?: No  Information entered by :: John Bowen  Activities of Daily Living In your present state of health, do you have any difficulty performing the following activities: 08/08/2019  Hearing? N  Vision? N  Difficulty concentrating or making decisions? N  Walking or climbing stairs? N  Dressing or bathing? N  Doing errands, shopping? N  Preparing Food and eating ? N  Using the Toilet? N  In the past six months, have you accidently leaked urine? N  Do you have problems with loss of bowel control? N  Managing your Medications? N  Managing your Finances? N  Housekeeping or managing your Housekeeping? N  Some recent data might be hidden     Immunizations and Health Maintenance Immunization History  Administered Date(s) Administered  . Influenza Split 03/20/2011  . Influenza Whole 04/02/2007  . Influenza, High Dose Seasonal PF 07/15/2018, 03/20/2019  . Influenza, Seasonal, Injecte, Preservative Fre 04/26/2012  . Td 05/01/2005  . Tdap 08/01/2016   Health Maintenance Due  Topic Date Due  . Hepatitis C Screening  Never done  . COLONOSCOPY  06/09/2015  . PNA vac Low Risk Adult (1 of 2 - PCV13) Never done    Patient Care Team: John Barrack, MD as PCP - General (Family Medicine) John Arabian, MD as Consulting Physician (Orthopedic Surgery)  Indicate any recent Medical Services you may have received from other than [facility] providers in the past year (date may be approximate).    Assessment:   This is a routine wellness examination for  John Bowen.  Hearing/Vision screen No exam data present  Dietary issues and exercise activities discussed: Current Exercise Habits: Home exercise routine, Type of exercise: walking, Time (Minutes): 30, Frequency (Times/Week): 4, Weekly Exercise (Minutes/Week): 120, Intensity: Mild  Goals   None    Depression Screen PHQ 2/9 Scores 08/08/2019 07/15/2018  PHQ - 2 Score 0 0    Fall Risk Fall Risk  08/08/2019  Falls in the past year? 0  Number falls in past yr: 0  Injury with Fall? 0  Risk for fall due to : Orthopedic patient  Follow up Falls evaluation completed;Education provided;Falls prevention discussed    Is the patient's home free of loose throw rugs in walkways, pet beds, electrical cords, etc?  yes      Grab bars in the bathroom? yes      Handrails on the stairs?   yes      Adequate lighting?   yes  Cognitive Function:     6CIT Screen 08/08/2019  What Year? 0 points  What month? 0 points  What time? 0 points  Count back from 20 0 points  Months in reverse 0 points  Repeat phrase 0 points  Total Score 0    Screening Tests Health Maintenance  Topic Date Due  . Hepatitis C Screening  Never done  . COLONOSCOPY  06/09/2015  . PNA vac Low Risk Adult (1 of 2 - PCV13) Never done  . TETANUS/TDAP  08/02/2026  . INFLUENZA VACCINE  Completed    Qualifies for Shingles Vaccine? Discussed and patient will check with pharmacy for coverage.  Patient education handout provided   Cancer Screenings: Lung: Low Dose CT Chest recommended if Age 59-80 years, 30 pack-year currently smoking OR have quit w/in 15years. Patient does not qualify. Colorectal: Cologuard information provided     Plan:    I have personally reviewed and addressed the Medicare Annual Wellness questionnaire and have noted the following in the patient's chart:  A. Medical and social history B. Use of alcohol, tobacco or illicit drugs  C. Current medications and supplements D. Functional ability and status E.   Nutritional status F.  Physical activity G. Advance directives H. List of other physicians I.  Hospitalizations, surgeries, and ER visits in previous 12 months J.  [place] such as hearing and vision if needed, cognitive and depression L. Referrals, records requested, and appointments- none   In addition, I have reviewed and discussed with patient certain preventive protocols, quality metrics, and best practice recommendations. A written personalized care plan for preventive services as well as general preventive health recommendations were provided to patient.   Signed,  John George, Bowen  Nurse Health Advisor   Nurse Notes: Patient has completed Covid vaccine Therapist, music)

## 2019-08-22 DIAGNOSIS — Z96652 Presence of left artificial knee joint: Secondary | ICD-10-CM | POA: Diagnosis not present

## 2019-08-22 DIAGNOSIS — Z471 Aftercare following joint replacement surgery: Secondary | ICD-10-CM | POA: Diagnosis not present

## 2019-08-22 DIAGNOSIS — M25561 Pain in right knee: Secondary | ICD-10-CM | POA: Diagnosis not present

## 2019-08-27 ENCOUNTER — Other Ambulatory Visit: Payer: Self-pay

## 2019-08-27 ENCOUNTER — Encounter: Payer: Self-pay | Admitting: Family Medicine

## 2019-08-27 ENCOUNTER — Ambulatory Visit (INDEPENDENT_AMBULATORY_CARE_PROVIDER_SITE_OTHER): Payer: Medicare HMO | Admitting: Family Medicine

## 2019-08-27 VITALS — BP 126/74 | HR 60 | Temp 97.6°F | Ht 70.0 in | Wt 183.0 lb

## 2019-08-27 DIAGNOSIS — Z6826 Body mass index (BMI) 26.0-26.9, adult: Secondary | ICD-10-CM | POA: Diagnosis not present

## 2019-08-27 DIAGNOSIS — E785 Hyperlipidemia, unspecified: Secondary | ICD-10-CM

## 2019-08-27 DIAGNOSIS — G4700 Insomnia, unspecified: Secondary | ICD-10-CM | POA: Diagnosis not present

## 2019-08-27 DIAGNOSIS — Z0001 Encounter for general adult medical examination with abnormal findings: Secondary | ICD-10-CM | POA: Diagnosis not present

## 2019-08-27 DIAGNOSIS — E663 Overweight: Secondary | ICD-10-CM | POA: Diagnosis not present

## 2019-08-27 DIAGNOSIS — R739 Hyperglycemia, unspecified: Secondary | ICD-10-CM

## 2019-08-27 DIAGNOSIS — Z1211 Encounter for screening for malignant neoplasm of colon: Secondary | ICD-10-CM | POA: Diagnosis not present

## 2019-08-27 DIAGNOSIS — I10 Essential (primary) hypertension: Secondary | ICD-10-CM

## 2019-08-27 DIAGNOSIS — Z125 Encounter for screening for malignant neoplasm of prostate: Secondary | ICD-10-CM

## 2019-08-27 DIAGNOSIS — M1732 Unilateral post-traumatic osteoarthritis, left knee: Secondary | ICD-10-CM | POA: Diagnosis not present

## 2019-08-27 LAB — CBC
HCT: 41.9 % (ref 39.0–52.0)
Hemoglobin: 14.3 g/dL (ref 13.0–17.0)
MCHC: 34.2 g/dL (ref 30.0–36.0)
MCV: 89.9 fl (ref 78.0–100.0)
Platelets: 258 10*3/uL (ref 150.0–400.0)
RBC: 4.66 Mil/uL (ref 4.22–5.81)
RDW: 13.5 % (ref 11.5–15.5)
WBC: 3.6 10*3/uL — ABNORMAL LOW (ref 4.0–10.5)

## 2019-08-27 LAB — COMPREHENSIVE METABOLIC PANEL
ALT: 12 U/L (ref 0–53)
AST: 13 U/L (ref 0–37)
Albumin: 4.3 g/dL (ref 3.5–5.2)
Alkaline Phosphatase: 83 U/L (ref 39–117)
BUN: 17 mg/dL (ref 6–23)
CO2: 26 mEq/L (ref 19–32)
Calcium: 8.9 mg/dL (ref 8.4–10.5)
Chloride: 106 mEq/L (ref 96–112)
Creatinine, Ser: 0.89 mg/dL (ref 0.40–1.50)
GFR: 85.48 mL/min (ref >60.00)
Glucose, Bld: 77 mg/dL (ref 70–99)
Potassium: 4.3 mEq/L (ref 3.5–5.1)
Sodium: 140 mEq/L (ref 135–145)
Total Bilirubin: 0.7 mg/dL (ref 0.2–1.2)
Total Protein: 6.3 g/dL (ref 6.0–8.3)

## 2019-08-27 LAB — LIPID PANEL
Cholesterol: 160 mg/dL (ref 0–200)
HDL: 55.5 mg/dL (ref >39.00)
LDL Cholesterol: 91 mg/dL (ref 0–99)
NonHDL: 104.08
Total CHOL/HDL Ratio: 3
Triglycerides: 64 mg/dL (ref 0.0–149.0)
VLDL: 12.8 mg/dL (ref 0.0–40.0)

## 2019-08-27 LAB — HEMOGLOBIN A1C: Hgb A1c MFr Bld: 5.3 % (ref 4.6–6.5)

## 2019-08-27 LAB — PSA: PSA: 0.99 ng/mL (ref 0.10–4.00)

## 2019-08-27 LAB — TSH: TSH: 1.12 u[IU]/mL (ref 0.35–4.50)

## 2019-08-27 MED ORDER — HYDROXYZINE HCL 50 MG PO TABS: 25.0000 mg | ORAL_TABLET | Freq: Every evening | ORAL | 1 refills | Status: DC | PRN

## 2019-08-27 NOTE — Assessment & Plan Note (Signed)
Stable.  Continue management per orthopedics. 

## 2019-08-27 NOTE — Patient Instructions (Signed)
It was very nice to see you today!  Please try the hydroxyzine.  Let me know if it is not working for you in the next few weeks.  We will check blood work today.  We will also get you set up to have a Cologuard.  Come back in 1 year for your next physical blood work, or sooner if needed.  Take care, Dr Jerline Pain  Please try these tips to maintain a healthy lifestyle:   Eat at least 3 REAL meals and 1-2 snacks per day.  Aim for no more than 5 hours between eating.  If you eat breakfast, please do so within one hour of getting up.    Each meal should contain half fruits/vegetables, one quarter protein, and one quarter carbs (no bigger than a computer mouse)   Cut down on sweet beverages. This includes juice, soda, and sweet tea.     Drink at least 1 glass of water with each meal and aim for at least 8 glasses per day   Exercise at least 150 minutes every week.    Preventive Care 80 Years and Older, Male Preventive care refers to lifestyle choices and visits with your health care provider that can promote health and wellness. This includes:  A yearly physical exam. This is also called an annual well check.  Regular dental and eye exams.  Immunizations.  Screening for certain conditions.  Healthy lifestyle choices, such as diet and exercise. What can I expect for my preventive care visit? Physical exam Your health care provider will check:  Height and weight. These may be used to calculate body mass index (BMI), which is a measurement that tells if you are at a healthy weight.  Heart rate and blood pressure.  Your skin for abnormal spots. Counseling Your health care provider may ask you questions about:  Alcohol, tobacco, and drug use.  Emotional well-being.  Home and relationship well-being.  Sexual activity.  Eating habits.  History of falls.  Memory and ability to understand (cognition).  Work and work Statistician. What immunizations do I  need?  Influenza (flu) vaccine  This is recommended every year. Tetanus, diphtheria, and pertussis (Tdap) vaccine  You may need a Td booster every 10 years. Varicella (chickenpox) vaccine  You may need this vaccine if you have not already been vaccinated. Zoster (shingles) vaccine  You may need this after age 71. Pneumococcal conjugate (PCV13) vaccine  One dose is recommended after age 73. Pneumococcal polysaccharide (PPSV23) vaccine  One dose is recommended after age 46. Measles, mumps, and rubella (MMR) vaccine  You may need at least one dose of MMR if you were born in 1957 or later. You may also need a second dose. Meningococcal conjugate (MenACWY) vaccine  You may need this if you have certain conditions. Hepatitis A vaccine  You may need this if you have certain conditions or if you travel or work in places where you may be exposed to hepatitis A. Hepatitis B vaccine  You may need this if you have certain conditions or if you travel or work in places where you may be exposed to hepatitis B. Haemophilus influenzae type b (Hib) vaccine  You may need this if you have certain conditions. You may receive vaccines as individual doses or as more than one vaccine together in one shot (combination vaccines). Talk with your health care provider about the risks and benefits of combination vaccines. What tests do I need? Blood tests  Lipid and cholesterol levels. These  may be checked every 5 years, or more frequently depending on your overall health.  Hepatitis C test.  Hepatitis B test. Screening  Lung cancer screening. You may have this screening every year starting at age 25 if you have a 30-pack-year history of smoking and currently smoke or have quit within the past 15 years.  Colorectal cancer screening. All adults should have this screening starting at age 74 and continuing until age 38. Your health care provider may recommend screening at age 66 if you are at  increased risk. You will have tests every 1-10 years, depending on your results and the type of screening test.  Prostate cancer screening. Recommendations will vary depending on your family history and other risks.  Diabetes screening. This is done by checking your blood sugar (glucose) after you have not eaten for a while (fasting). You may have this done every 1-3 years.  Abdominal aortic aneurysm (AAA) screening. You may need this if you are a current or former smoker.  Sexually transmitted disease (STD) testing. Follow these instructions at home: Eating and drinking  Eat a diet that includes fresh fruits and vegetables, whole grains, lean protein, and low-fat dairy products. Limit your intake of foods with high amounts of sugar, saturated fats, and salt.  Take vitamin and mineral supplements as recommended by your health care provider.  Do not drink alcohol if your health care provider tells you not to drink.  If you drink alcohol: ? Limit how much you have to 0-2 drinks a day. ? Be aware of how much alcohol is in your drink. In the [place] one drink equals one 12 oz bottle of beer (355 mL), one 5 oz glass of wine (148 mL), or one 1 oz glass of hard liquor (44 mL). Lifestyle  Take daily care of your teeth and gums.  Stay active. Exercise for at least 30 minutes on 5 or more days each week.  Do not use any products that contain nicotine or tobacco, such as cigarettes, e-cigarettes, and chewing tobacco. If you need help quitting, ask your health care provider.  If you are sexually active, practice safe sex. Use a condom or other form of protection to prevent STIs (sexually transmitted infections).  Talk with your health care provider about taking a low-dose aspirin or statin. What's next?  Visit your health care provider once a year for a well check visit.  Ask your health care provider how often you should have your eyes and teeth checked.  Stay up to date on all  vaccines. This information is not intended to replace advice given to you by your health care provider. Make sure you discuss any questions you have with your health care provider. Document Revised: 05/09/2018 Document Reviewed: 05/09/2018 Elsevier Patient Education  2020 [employer].

## 2019-08-27 NOTE — Assessment & Plan Note (Signed)
At goal.  Continue lisinopril 40 mg daily.

## 2019-08-27 NOTE — Assessment & Plan Note (Signed)
Check lipid panel.  Did not tolerate atorvastatin due to side effects of brain fog.

## 2019-08-27 NOTE — Assessment & Plan Note (Addendum)
Trazodone has not been effect. Will start hydoxyzine 25-100mg  nightly as needed. If not improvement would consider trial of lunesta.

## 2019-08-27 NOTE — Progress Notes (Signed)
Chief Complaint:  John Bowen is a 64 y.o. male who presents today for his annual comprehensive physical exam.    Assessment/Plan:  Chronic Problems Addressed Today: Insomnia Trazodone has not been effect. Will start hydoxyzine 25-100mg  nightly as needed. If not improvement would consider trial of lunesta.   Essential hypertension At goal.  Continue lisinopril 40 mg daily.  Osteoarthritis of left knee Stable.  Continue management per orthopedics.  Dyslipidemia Check lipid panel.  Did not tolerate atorvastatin due to side effects of brain fog.   Body mass index is 26.26 kg/m. / Overweight BMI Metric Follow Up - 08/27/19 0915      BMI Metric Follow Up-Please document annually   BMI Metric Follow Up  Education provided       Preventative Healthcare: Order Cologuard.  Check PSA.  Has already received both Covid vaccines.  Patient Counseling(The following topics were reviewed and/or handout was given):  -Nutrition: Stressed importance of moderation in sodium/caffeine intake, saturated fat and cholesterol, caloric balance, sufficient intake of fresh fruits, vegetables, and fiber.  -Stressed the importance of regular exercise.   -Substance Abuse: Discussed cessation/primary prevention of tobacco, alcohol, or other drug use; driving or other dangerous activities under the influence; availability of treatment for abuse.   -Injury prevention: Discussed safety belts, safety helmets, smoke detector, smoking near bedding or upholstery.   -Sexuality: Discussed sexually transmitted diseases, partner selection, use of condoms, avoidance of unintended pregnancy and contraceptive alternatives.   -Dental health: Discussed importance of regular tooth brushing, flossing, and dental visits.  -Health maintenance and immunizations reviewed. Please refer to Health maintenance section.  Return to care in 1 year for next preventative visit.     Subjective:  HPI:  He has no acute  complaints today.   Lifestyle Diet: Balanced. Plenty of fruits and vegetables.  Exercise: Likes to play golf.   Depression screen [facility] 2/9 08/08/2019  Decreased Interest 0  Down, Depressed, Hopeless 0  PHQ - 2 Score 0   ROS: Per HPI, otherwise a complete review of systems was negative.   PMH:  The following were reviewed and entered/updated in epic: Past Medical History:  Diagnosis Date  . Asthma    as a child  . Complication of anesthesia   . DJD (degenerative joint disease)    knees  . Dysfunction of sleep stage or arousal   . Hypertension   . Meningeal disorder   . Meningitis    Zoster  . PONV (postoperative nausea and vomiting)    vomiting when got home after arthroscopic  knee surgery  . Shingles   . Sleep apnea    does not tolerate cpap   Patient Active Problem List   Diagnosis Date Noted  . Unstable knee, left 07/31/2018  . Failed total knee arthroplasty ([place]) 07/31/2018  . Dyslipidemia 07/17/2018  . OSA (obstructive sleep apnea) 09/06/2016  . Peyronie disease 06/13/2016  . Osteoarthritis of left knee 04/20/2014  . Essential hypertension 05/25/2008  . Insomnia 04/02/2007   Past Surgical History:  Procedure Laterality Date  . arthroscopic knee     right  . FRACTURE SURGERY       Left wrist and right hand  . JOINT REPLACEMENT     left knee 1979 , left knee 2018  . TOTAL KNEE REVISION Left 07/31/2018   Procedure: Left knee polyethylene revision;  Surgeon: Gaynelle Arabian, MD;  Location: [facility];  Service: Orthopedics;  Laterality: Left;  71min    Family History  Problem Relation Age of Onset  . Hypertension Other     Medications- reviewed and updated Current Outpatient Medications  Medication Sig Dispense Refill  . hydrocortisone 2.5 % cream Apply 1 application topically daily as needed (itching).    Marland Kitchen lisinopril (ZESTRIL) 40 MG tablet Take 1 tablet (40 mg total) by mouth daily. 90 tablet 4  . traZODone (DESYREL) 50 MG tablet Take 2 tablets (100 mg  total) by mouth at bedtime. 180 tablet 0  . hydrOXYzine (ATARAX/VISTARIL) 50 MG tablet Take 0.5-2 tablets (25-100 mg total) by mouth at bedtime as needed (insomnia). 90 tablet 1   No current facility-administered medications for this visit.    Allergies-reviewed and updated No Known Allergies  Social History   Socioeconomic History  . Marital status: Married    Spouse name: Not on file  . Number of children: 1  . Years of education: Not on file  . Highest education level: Not on file  Occupational History  . Occupation: Retired   Tobacco Use  . Smoking status: Never Smoker  . Smokeless tobacco: Never Used  Substance and Sexual Activity  . Alcohol use: Yes    Alcohol/week: 3.0 standard drinks    Types: 3 Cans of beer per week    Comment: a week  . Drug use: No  . Sexual activity: Yes  Other Topics Concern  . Not on file  Social History Narrative   1 Son    Lives with spouse    Social Determinants of Health   Financial Resource Strain:   . Difficulty of Paying Living Expenses:   Food Insecurity:   . Worried About Charity fundraiser in the Last Year:   . Arboriculturist in the Last Year:   Transportation Needs:   . Film/video editor (Medical):   Marland Kitchen Lack of Transportation (Non-Medical):   Physical Activity:   . Days of Exercise per Week:   . Minutes of Exercise per Session:   Stress:   . Feeling of Stress :   Social Connections:   . Frequency of Communication with Friends and Family:   . Frequency of Social Gatherings with Friends and Family:   . Attends Religious Services:   . Active Member of Clubs or Organizations:   . Attends Archivist Meetings:   Marland Kitchen Marital Status:         Objective:  Physical Exam: BP 126/74   Pulse 60   Temp 97.6 F (36.4 C)   Ht 5\' 10"  (1.778 m)   Wt 183 lb (83 kg)   SpO2 97%   BMI 26.26 kg/m   Body mass index is 26.26 kg/m. Wt Readings from Last 3 Encounters:  08/27/19 183 lb (83 kg)  07/31/18 191 lb (86.6  kg)  07/24/18 191 lb (86.6 kg)   Gen: NAD, resting comfortably HEENT: TMs normal bilaterally. OP clear. No thyromegaly noted.  CV: RRR with no murmurs appreciated Pulm: NWOB, CTAB with no crackles, wheezes, or rhonchi GI: Normal bowel sounds present. Soft, Nontender, Nondistended. MSK: no edema, cyanosis, or clubbing noted Skin: warm, dry Neuro: CN2-12 grossly intact. Strength 5/5 in upper and lower extremities. Reflexes symmetric and intact bilaterally.  Psych: Normal affect and thought content     Jazmyn Offner M. Jerline Pain, MD 08/27/2019 9:17 AM

## 2019-08-28 NOTE — Progress Notes (Signed)
Please inform patient of the following:  Blood work is all STABLE. Recommend that he keep up the good work and we can recheck in a year or so.  Algis Greenhouse. Jerline Pain, MD 08/28/2019 8:07 AM

## 2019-09-01 DIAGNOSIS — M25662 Stiffness of left knee, not elsewhere classified: Secondary | ICD-10-CM | POA: Diagnosis not present

## 2019-09-01 DIAGNOSIS — Z1211 Encounter for screening for malignant neoplasm of colon: Secondary | ICD-10-CM | POA: Diagnosis not present

## 2019-09-01 DIAGNOSIS — Z0001 Encounter for general adult medical examination with abnormal findings: Secondary | ICD-10-CM | POA: Diagnosis not present

## 2019-09-01 LAB — COLOGUARD: Cologuard: NEGATIVE

## 2019-09-02 ENCOUNTER — Telehealth: Payer: Self-pay | Admitting: Family Medicine
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
## 2019-09-03 ENCOUNTER — Encounter: Payer: Self-pay | Admitting: Family Medicine

## 2019-09-03 ENCOUNTER — Telehealth (INDEPENDENT_AMBULATORY_CARE_PROVIDER_SITE_OTHER): Payer: Medicare HMO | Admitting: Family Medicine

## 2019-09-03 VITALS — Ht 70.0 in | Wt 183.0 lb

## 2019-09-03 DIAGNOSIS — G4700 Insomnia, unspecified: Secondary | ICD-10-CM

## 2019-09-03 LAB — COLOGUARD: Cologuard: NEGATIVE

## 2019-09-03 MED ORDER — TRAZODONE HCL 100 MG PO TABS: 150.0000 mg | ORAL_TABLET | Freq: Every day | ORAL | 5 refills | Status: DC

## 2019-09-03 NOTE — Progress Notes (Signed)
   John Bowen is a 64 y.o. male who presents today for a telephone visit.  Assessment/Plan:  Chronic Problems Addressed Today: No problem-specific Assessment & Plan notes found for this encounter.     Subjective:  HPI:  See A/P.        Objective/Observations   NAD  Telephone Visit   I connected with Sidney Ace on 09/03/19 at 11:20 AM EDT via telephone and verified that I am speaking with the correct person using two identifiers. I discussed the limitations of evaluation and management by telemedicine and the availability of in person appointments. The patient expressed understanding and agreed to proceed.   Patient location: Home Provider location: [place] participating in the virtual visit: Myself and Patient  A total of 12 minutes were spent on medical discussion.      Algis Greenhouse. Jerline Pain, MD 09/03/2019 11:31 AM

## 2019-09-03 NOTE — Telephone Encounter (Signed)
Appt schedule today 09/03/19

## 2019-09-03 NOTE — Assessment & Plan Note (Signed)
Was not able to tolerate hydroxyzine and did not find it to be effective.  Back on the trazodone which seemed to help a little bit.  Will increase dose of 150 mg daily.  He will check in with me in a few weeks.  Will uptitrate as needed.

## 2019-09-05 ENCOUNTER — Encounter: Payer: Self-pay | Admitting: Family Medicine

## 2019-09-05 ENCOUNTER — Telehealth: Payer: Self-pay | Admitting: *Deleted

## 2019-09-05 NOTE — Telephone Encounter (Signed)
Left voice message with Cologuard results= Negative

## 2019-09-09 DIAGNOSIS — M25662 Stiffness of left knee, not elsewhere classified: Secondary | ICD-10-CM | POA: Diagnosis not present

## 2019-09-11 DIAGNOSIS — M25662 Stiffness of left knee, not elsewhere classified: Secondary | ICD-10-CM | POA: Diagnosis not present

## 2019-09-16 DIAGNOSIS — M25662 Stiffness of left knee, not elsewhere classified: Secondary | ICD-10-CM | POA: Diagnosis not present

## 2019-09-18 DIAGNOSIS — M25662 Stiffness of left knee, not elsewhere classified: Secondary | ICD-10-CM | POA: Diagnosis not present

## 2019-09-23 DIAGNOSIS — M25662 Stiffness of left knee, not elsewhere classified: Secondary | ICD-10-CM | POA: Diagnosis not present

## 2019-09-25 DIAGNOSIS — M25662 Stiffness of left knee, not elsewhere classified: Secondary | ICD-10-CM | POA: Diagnosis not present

## 2019-09-26 DIAGNOSIS — M25462 Effusion, left knee: Secondary | ICD-10-CM | POA: Diagnosis not present

## 2019-09-26 DIAGNOSIS — Z96652 Presence of left artificial knee joint: Secondary | ICD-10-CM | POA: Diagnosis not present

## 2019-11-06 DIAGNOSIS — M1711 Unilateral primary osteoarthritis, right knee: Secondary | ICD-10-CM | POA: Diagnosis not present

## 2019-11-06 DIAGNOSIS — Z471 Aftercare following joint replacement surgery: Secondary | ICD-10-CM | POA: Diagnosis not present

## 2019-11-06 DIAGNOSIS — Z96652 Presence of left artificial knee joint: Secondary | ICD-10-CM | POA: Diagnosis not present

## 2019-12-17 DIAGNOSIS — L309 Dermatitis, unspecified: Secondary | ICD-10-CM | POA: Diagnosis not present

## 2019-12-17 DIAGNOSIS — L738 Other specified follicular disorders: Secondary | ICD-10-CM | POA: Diagnosis not present

## 2019-12-17 DIAGNOSIS — D225 Melanocytic nevi of trunk: Secondary | ICD-10-CM | POA: Diagnosis not present

## 2019-12-17 DIAGNOSIS — L218 Other seborrheic dermatitis: Secondary | ICD-10-CM | POA: Diagnosis not present

## 2020-03-28 ENCOUNTER — Other Ambulatory Visit: Payer: Self-pay | Admitting: Family Medicine

## 2020-04-07 DIAGNOSIS — M1711 Unilateral primary osteoarthritis, right knee: Secondary | ICD-10-CM | POA: Diagnosis not present

## 2020-04-07 DIAGNOSIS — Z96652 Presence of left artificial knee joint: Secondary | ICD-10-CM | POA: Diagnosis not present

## 2020-04-07 DIAGNOSIS — M5416 Radiculopathy, lumbar region: Secondary | ICD-10-CM | POA: Diagnosis not present

## 2020-04-07 DIAGNOSIS — M5136 Other intervertebral disc degeneration, lumbar region: Secondary | ICD-10-CM | POA: Diagnosis not present

## 2020-04-08 DIAGNOSIS — G8929 Other chronic pain: Secondary | ICD-10-CM | POA: Diagnosis not present

## 2020-04-08 DIAGNOSIS — M545 Low back pain, unspecified: Secondary | ICD-10-CM | POA: Diagnosis not present

## 2020-04-08 DIAGNOSIS — M5136 Other intervertebral disc degeneration, lumbar region: Secondary | ICD-10-CM | POA: Diagnosis not present

## 2020-04-08 DIAGNOSIS — M6281 Muscle weakness (generalized): Secondary | ICD-10-CM | POA: Diagnosis not present

## 2020-04-13 DIAGNOSIS — M5136 Other intervertebral disc degeneration, lumbar region: Secondary | ICD-10-CM | POA: Diagnosis not present

## 2020-04-13 DIAGNOSIS — M545 Low back pain, unspecified: Secondary | ICD-10-CM | POA: Diagnosis not present

## 2020-04-13 DIAGNOSIS — G8929 Other chronic pain: Secondary | ICD-10-CM | POA: Diagnosis not present

## 2020-04-13 DIAGNOSIS — M6281 Muscle weakness (generalized): Secondary | ICD-10-CM | POA: Diagnosis not present

## 2020-04-15 DIAGNOSIS — M545 Low back pain, unspecified: Secondary | ICD-10-CM | POA: Diagnosis not present

## 2020-04-15 DIAGNOSIS — G8929 Other chronic pain: Secondary | ICD-10-CM | POA: Diagnosis not present

## 2020-04-15 DIAGNOSIS — M5136 Other intervertebral disc degeneration, lumbar region: Secondary | ICD-10-CM | POA: Diagnosis not present

## 2020-04-15 DIAGNOSIS — M6281 Muscle weakness (generalized): Secondary | ICD-10-CM | POA: Diagnosis not present

## 2020-04-17 ENCOUNTER — Other Ambulatory Visit: Payer: Self-pay | Admitting: Family Medicine

## 2020-04-19 ENCOUNTER — Telehealth: Payer: Self-pay

## 2020-04-19 ENCOUNTER — Ambulatory Visit (INDEPENDENT_AMBULATORY_CARE_PROVIDER_SITE_OTHER): Payer: Medicare HMO

## 2020-04-19 DIAGNOSIS — M5136 Other intervertebral disc degeneration, lumbar region: Secondary | ICD-10-CM | POA: Diagnosis not present

## 2020-04-19 DIAGNOSIS — G8929 Other chronic pain: Secondary | ICD-10-CM | POA: Diagnosis not present

## 2020-04-19 DIAGNOSIS — Z23 Encounter for immunization: Secondary | ICD-10-CM

## 2020-04-19 DIAGNOSIS — M545 Low back pain, unspecified: Secondary | ICD-10-CM | POA: Diagnosis not present

## 2020-04-19 DIAGNOSIS — M6281 Muscle weakness (generalized): Secondary | ICD-10-CM | POA: Diagnosis not present

## 2020-04-19 MED ORDER — TRAZODONE HCL 100 MG PO TABS: ORAL_TABLET | ORAL | 0 refills | Status: DC

## 2020-04-19 NOTE — Telephone Encounter (Signed)
MEDICATION: traZODone (DESYREL) 100 MG tablet  PHARMACY:  [facility] [address] [place] [address] Phone:  [phone]  Fax:  [phone]       Comments:   **Let patient know to contact pharmacy at the end of the day to make sure medication is ready. **  ** Please notify patient to allow 48-72 hours to process**  **Encourage patient to contact the pharmacy for refills or they can request refills through [facility]**

## 2020-04-19 NOTE — Telephone Encounter (Signed)
Medication sent in. 

## 2020-04-21 DIAGNOSIS — M545 Low back pain, unspecified: Secondary | ICD-10-CM | POA: Diagnosis not present

## 2020-04-21 DIAGNOSIS — M5136 Other intervertebral disc degeneration, lumbar region: Secondary | ICD-10-CM | POA: Diagnosis not present

## 2020-04-21 DIAGNOSIS — M6281 Muscle weakness (generalized): Secondary | ICD-10-CM | POA: Diagnosis not present

## 2020-04-21 DIAGNOSIS — G8929 Other chronic pain: Secondary | ICD-10-CM | POA: Diagnosis not present

## 2020-04-28 DIAGNOSIS — G8929 Other chronic pain: Secondary | ICD-10-CM | POA: Diagnosis not present

## 2020-04-28 DIAGNOSIS — M545 Low back pain, unspecified: Secondary | ICD-10-CM | POA: Diagnosis not present

## 2020-04-28 DIAGNOSIS — M6281 Muscle weakness (generalized): Secondary | ICD-10-CM | POA: Diagnosis not present

## 2020-04-28 DIAGNOSIS — M5136 Other intervertebral disc degeneration, lumbar region: Secondary | ICD-10-CM | POA: Diagnosis not present

## 2020-04-30 DIAGNOSIS — M5136 Other intervertebral disc degeneration, lumbar region: Secondary | ICD-10-CM | POA: Diagnosis not present

## 2020-04-30 DIAGNOSIS — G8929 Other chronic pain: Secondary | ICD-10-CM | POA: Diagnosis not present

## 2020-04-30 DIAGNOSIS — M6281 Muscle weakness (generalized): Secondary | ICD-10-CM | POA: Diagnosis not present

## 2020-04-30 DIAGNOSIS — M545 Low back pain, unspecified: Secondary | ICD-10-CM | POA: Diagnosis not present

## 2020-05-04 DIAGNOSIS — M545 Low back pain, unspecified: Secondary | ICD-10-CM | POA: Diagnosis not present

## 2020-05-04 DIAGNOSIS — M6281 Muscle weakness (generalized): Secondary | ICD-10-CM | POA: Diagnosis not present

## 2020-05-04 DIAGNOSIS — G8929 Other chronic pain: Secondary | ICD-10-CM | POA: Diagnosis not present

## 2020-05-04 DIAGNOSIS — M5136 Other intervertebral disc degeneration, lumbar region: Secondary | ICD-10-CM | POA: Diagnosis not present

## 2020-05-06 DIAGNOSIS — Z96652 Presence of left artificial knee joint: Secondary | ICD-10-CM | POA: Diagnosis not present

## 2020-05-06 DIAGNOSIS — M1711 Unilateral primary osteoarthritis, right knee: Secondary | ICD-10-CM | POA: Diagnosis not present

## 2020-05-13 DIAGNOSIS — G8929 Other chronic pain: Secondary | ICD-10-CM | POA: Diagnosis not present

## 2020-05-13 DIAGNOSIS — M545 Low back pain, unspecified: Secondary | ICD-10-CM | POA: Diagnosis not present

## 2020-05-13 DIAGNOSIS — M5136 Other intervertebral disc degeneration, lumbar region: Secondary | ICD-10-CM | POA: Diagnosis not present

## 2020-05-13 DIAGNOSIS — M6281 Muscle weakness (generalized): Secondary | ICD-10-CM | POA: Diagnosis not present

## 2020-05-19 ENCOUNTER — Other Ambulatory Visit: Payer: Self-pay | Admitting: Family Medicine

## 2020-06-12 ENCOUNTER — Other Ambulatory Visit: Payer: Self-pay | Admitting: *Deleted

## 2020-06-12 ENCOUNTER — Other Ambulatory Visit: Payer: Self-pay | Admitting: Family Medicine

## 2020-06-24 ENCOUNTER — Other Ambulatory Visit: Payer: Self-pay | Admitting: Family Medicine

## 2020-07-17 ENCOUNTER — Other Ambulatory Visit: Payer: Self-pay | Admitting: Family Medicine

## 2020-07-19 ENCOUNTER — Telehealth: Payer: Self-pay

## 2020-07-19 NOTE — Telephone Encounter (Signed)
Patient normally gets a 3 month supply of traZODone (DESYREL) 100 MG tablet  and it is now being sent as a 1 month supply  He would like it in 3 month supply of medication so if we can can you send in the remaining two months or next refill send in full 3 months  Please advise  Thank you !

## 2020-07-19 NOTE — Telephone Encounter (Signed)
Notified patient he needs appt for additional refills he will call to schedule appt

## 2020-08-23 ENCOUNTER — Other Ambulatory Visit: Payer: Self-pay

## 2020-08-23 ENCOUNTER — Encounter: Payer: Self-pay | Admitting: Family Medicine

## 2020-08-23 ENCOUNTER — Ambulatory Visit (INDEPENDENT_AMBULATORY_CARE_PROVIDER_SITE_OTHER): Payer: Medicare HMO

## 2020-08-23 ENCOUNTER — Ambulatory Visit (INDEPENDENT_AMBULATORY_CARE_PROVIDER_SITE_OTHER): Payer: Medicare HMO | Admitting: Family Medicine

## 2020-08-23 VITALS — BP 140/82 | HR 51 | Wt 181.0 lb

## 2020-08-23 VITALS — BP 147/83 | HR 51 | Temp 97.7°F | Ht 70.0 in | Wt 181.8 lb

## 2020-08-23 DIAGNOSIS — E785 Hyperlipidemia, unspecified: Secondary | ICD-10-CM | POA: Diagnosis not present

## 2020-08-23 DIAGNOSIS — R6889 Other general symptoms and signs: Secondary | ICD-10-CM

## 2020-08-23 DIAGNOSIS — Z0000 Encounter for general adult medical examination without abnormal findings: Secondary | ICD-10-CM | POA: Diagnosis not present

## 2020-08-23 DIAGNOSIS — G4700 Insomnia, unspecified: Secondary | ICD-10-CM | POA: Diagnosis not present

## 2020-08-23 DIAGNOSIS — I10 Essential (primary) hypertension: Secondary | ICD-10-CM | POA: Diagnosis not present

## 2020-08-23 DIAGNOSIS — R5383 Other fatigue: Secondary | ICD-10-CM

## 2020-08-23 LAB — LIPID PANEL
Cholesterol: 172 mg/dL (ref 0–200)
HDL: 51.7 mg/dL (ref >39.00)
LDL Cholesterol: 102 mg/dL — ABNORMAL HIGH (ref 0–99)
NonHDL: 120.37
Total CHOL/HDL Ratio: 3
Triglycerides: 91 mg/dL (ref 0.0–149.0)
VLDL: 18.2 mg/dL (ref 0.0–40.0)

## 2020-08-23 LAB — COMPREHENSIVE METABOLIC PANEL
ALT: 18 U/L (ref 0–53)
AST: 15 U/L (ref 0–37)
Albumin: 4.5 g/dL (ref 3.5–5.2)
Alkaline Phosphatase: 84 U/L (ref 39–117)
BUN: 16 mg/dL (ref 6–23)
CO2: 27 mEq/L (ref 19–32)
Calcium: 8.9 mg/dL (ref 8.4–10.5)
Chloride: 107 mEq/L (ref 96–112)
Creatinine, Ser: 0.97 mg/dL (ref 0.40–1.50)
GFR: 80.98 mL/min (ref >60.00)
Glucose, Bld: 92 mg/dL (ref 70–99)
Potassium: 4.1 mEq/L (ref 3.5–5.1)
Sodium: 142 mEq/L (ref 135–145)
Total Bilirubin: 0.5 mg/dL (ref 0.2–1.2)
Total Protein: 6.3 g/dL (ref 6.0–8.3)

## 2020-08-23 LAB — CBC
HCT: 44.5 % (ref 39.0–52.0)
Hemoglobin: 15.1 g/dL (ref 13.0–17.0)
MCHC: 33.9 g/dL (ref 30.0–36.0)
MCV: 89.8 fl (ref 78.0–100.0)
Platelets: 271 10*3/uL (ref 150.0–400.0)
RBC: 4.96 Mil/uL (ref 4.22–5.81)
RDW: 13.7 % (ref 11.5–15.5)
WBC: 4.1 10*3/uL (ref 4.0–10.5)

## 2020-08-23 LAB — TSH: TSH: 1.38 u[IU]/mL (ref 0.35–4.50)

## 2020-08-23 LAB — VITAMIN B12: Vitamin B-12: 183 pg/mL — ABNORMAL LOW (ref 211–911)

## 2020-08-23 LAB — VITAMIN D 25 HYDROXY (VIT D DEFICIENCY, FRACTURES): VITD: 24.31 ng/mL — ABNORMAL LOW (ref 30.00–100.00)

## 2020-08-23 MED ORDER — BELSOMRA 10 MG PO TABS: 10.0000 mg | ORAL_TABLET | Freq: Every day | ORAL | 5 refills | Status: DC

## 2020-08-23 MED ORDER — LISINOPRIL 40 MG PO TABS: 40.0000 mg | ORAL_TABLET | Freq: Every day | ORAL | 1 refills | Status: DC

## 2020-08-23 MED ORDER — TRAZODONE HCL 100 MG PO TABS: 150.0000 mg | ORAL_TABLET | Freq: Every day | ORAL | 3 refills | Status: DC

## 2020-08-23 NOTE — Progress Notes (Signed)
Please inform patient of the following:  B12 is low.  This could explain some of the symptoms.  Would like for him to start B12 supplementation.  We can start him on the protocol here.  Vitamin D is low.  Would like for him to start 50,000 international units weekly and we can recheck in 3 to 6 months.  All of his other labs are stable.  John Bowen. Jerline Pain, MD 08/23/2020 12:37 PM

## 2020-08-23 NOTE — Assessment & Plan Note (Signed)
Check labs.  Has not tolerated statins in the past.

## 2020-08-23 NOTE — Assessment & Plan Note (Signed)
Trazodone has not been very effective.  He has also tried melatonin in combination with no significant improvement.  He has tried Ambien and hydroxyzine in the past which did not work well.  We will try Belsomra.  He will check in in a couple weeks via MyChart

## 2020-08-23 NOTE — Assessment & Plan Note (Signed)
At goal per [facility] 8.  Continue lisinopril 40 mg daily.  Check labs today.

## 2020-08-23 NOTE — Patient Instructions (Signed)
It was very nice to see you today!  Please try the Belsomra.  Send a message in a few weeks let me know how this is working for you.  We will check blood work today.  Take care, Dr Jerline Pain  PLEASE NOTE:  If you had any lab tests please let us know if you have not heard back within a few days. You may see your results on mychart before we have a chance to review them but we will give you a call once they are reviewed by [place]. If we ordered any referrals today, please let us know if you have not heard from their office within the next week.   Please try these tips to maintain a healthy lifestyle:   Eat at least 3 REAL meals and 1-2 snacks per day.  Aim for no more than 5 hours between eating.  If you eat breakfast, please do so within one hour of getting up.    Each meal should contain half fruits/vegetables, one quarter protein, and one quarter carbs (no bigger than a computer mouse)   Cut down on sweet beverages. This includes juice, soda, and sweet tea.     Drink at least 1 glass of water with each meal and aim for at least 8 glasses per day   Exercise at least 150 minutes every week.

## 2020-08-23 NOTE — Progress Notes (Signed)
Subjective:   John Bowen is a 65 y.o. male who presents for an Initial Medicare Annual Wellness Visit.  Review of Systems     Cardiac Risk Factors include: advanced age (>20men, >11 women);dyslipidemia;hypertension     Objective:    Today's Vitals   08/23/20 0920 08/23/20 0926  BP:  140/82  Pulse:  (!) 51  SpO2:  95%  Weight:  181 lb (82.1 kg)  PainSc: 2     Body mass index is 25.97 kg/m.  Advanced Directives 08/08/2019 07/31/2018 07/24/2018  Does Patient Have a Medical Advance Directive? No No No  Would patient like information on creating a medical advance directive? Yes (MAU/Ambulatory/Procedural Areas - Information given) No - Patient declined No - Patient declined    Current Medications (verified) Outpatient Encounter Medications as of 08/23/2020  Medication Sig  . hydrocortisone 2.5 % cream Apply 1 application topically daily as needed (itching).  Marland Kitchen lisinopril (ZESTRIL) 40 MG tablet Take 1 tablet (40 mg total) by mouth daily.  . Suvorexant (BELSOMRA) 10 MG TABS Take 10 mg by mouth at bedtime.  . traZODone (DESYREL) 100 MG tablet Take 1.5 tablets (150 mg total) by mouth at bedtime.  Marland Kitchen PFIZER-BIONTECH COVID-19 VACC 30 MCG/0.3ML injection    No facility-administered encounter medications on file as of 08/23/2020.    Allergies (verified) Patient has no known allergies.   History: Past Medical History:  Diagnosis Date  . Asthma    as a child  . Complication of anesthesia   . DJD (degenerative joint disease)    knees  . Dysfunction of sleep stage or arousal   . Hypertension   . Meningeal disorder   . Meningitis    Zoster  . PONV (postoperative nausea and vomiting)    vomiting when got home after arthroscopic  knee surgery  . Shingles   . Sleep apnea    does not tolerate cpap   Past Surgical History:  Procedure Laterality Date  . arthroscopic knee     right  . FRACTURE SURGERY       Left wrist and right hand  . JOINT REPLACEMENT     left knee 1979  , left knee 2018  . TOTAL KNEE REVISION Left 07/31/2018   Procedure: Left knee polyethylene revision;  Surgeon: John Arabian, MD;  Location: [facility];  Service: Orthopedics;  Laterality: Left;  54min   Family History  Problem Relation Age of Onset  . Hypertension Other    Social History   Socioeconomic History  . Marital status: Married    Spouse name: Not on file  . Number of children: 1  . Years of education: Not on file  . Highest education level: Not on file  Occupational History  . Occupation: Retired   Tobacco Use  . Smoking status: Never Smoker  . Smokeless tobacco: Never Used  Vaping Use  . Vaping Use: Never used  Substance and Sexual Activity  . Alcohol use: Yes    Alcohol/week: 3.0 standard drinks    Types: 3 Cans of beer per week    Comment: a week  . Drug use: No  . Sexual activity: Yes  Other Topics Concern  . Not on file  Social History Narrative   1 Son    Lives with spouse    Social Determinants of Health   Financial Resource Strain: Low Risk   . Difficulty of Paying Living Expenses: Not hard at all  Food Insecurity: No Food Insecurity  . Worried About  Running Out of Food in the Last Year: Never true  . Ran Out of Food in the Last Year: Never true  Transportation Needs: No Transportation Needs  . Lack of Transportation (Medical): No  . Lack of Transportation (Non-Medical): No  Physical Activity: Insufficiently Active  . Days of Exercise per Week: 7 days  . Minutes of Exercise per Session: 20 min  Stress: No Stress Concern Present  . Feeling of Stress : Not at all  Social Connections: Moderately Integrated  . Frequency of Communication with Friends and Family: Three times a week  . Frequency of Social Gatherings with Friends and Family: Three times a week  . Attends Religious Services: Never  . Active Member of Clubs or Organizations: Yes  . Attends Archivist Meetings: 1 to 4 times per year  . Marital Status: Married    Tobacco  Counseling Counseling given: Not Answered   Clinical Intake:     Pain : 0-10 Pain Score: 2  Pain Type: Chronic pain Pain Location: Knee Pain Orientation: Left Pain Descriptors / Indicators: Aching,Dull Pain Onset: More than a month ago Pain Frequency: Intermittent     BMI - recorded: 26.09 Nutritional Status: BMI 25 -29 Overweight Nutritional Risks: None Diabetes: No     Diabetic?No  Interpreter Needed?: No  Information entered by :: John Rakes, LPN   Activities of Daily Living In your present state of health, do you have any difficulty performing the following activities: 08/23/2020 08/23/2020  Hearing? N N  Vision? N N  Difficulty concentrating or making decisions? - N  Walking or climbing stairs? John Bowen  Comment going down stairs is painful after Knee replacement  Dressing or bathing? N N  Doing errands, shopping? N N  Preparing Food and eating ? N -  Using the Toilet? N -  In the past six months, have you accidently leaked urine? N -  Do you have problems with loss of bowel control? N -  Managing your Medications? N -  Managing your Finances? N -  Housekeeping or managing your Housekeeping? N -  Some recent data might be hidden    Patient Care Team: John Barrack, MD as PCP - General (Family Medicine) John Arabian, MD as Consulting Physician (Orthopedic Surgery)  Indicate any recent Medical Services you may have received from other than [facility] providers in the past year (date may be approximate).     Assessment:   This is a routine wellness examination for John Bowen.  Hearing/Vision screen  Hearing Screening   125Hz  250Hz  500Hz  1000Hz  2000Hz  3000Hz  4000Hz  6000Hz  8000Hz   Right ear:           Left ear:           Comments: Pt denies any hearing issues   Vision Screening Comments: Pt [facility] up [facility] for annual [facility] exams   Dietary issues and exercise activities discussed: Current Exercise Habits: Home exercise routine, Type of  exercise: Other - see comments;walking (golf), Time (Minutes): 20  Goals   None    Depression Screen PHQ 2/9 Scores 08/23/2020 08/23/2020 08/08/2019 07/15/2018  PHQ - 2 Score 0 0 0 0    Fall Risk Fall Risk  08/23/2020 08/27/2019 08/08/2019  Falls in the past year? 0 0 0  Number falls in past yr: 0 - 0  Injury with Fall? 0 - 0  Risk for fall due to : Impaired vision - Orthopedic patient  Follow up Falls prevention discussed - Falls evaluation completed;Education  provided;Falls prevention discussed    FALL RISK PREVENTION PERTAINING TO THE HOME:  Any stairs in or around the home? Yes  If so, are there any without handrails? No  Home free of loose throw rugs in walkways, pet beds, electrical cords, etc? Yes  Adequate lighting in your home to reduce risk of falls? Yes   ASSISTIVE DEVICES UTILIZED TO PREVENT FALLS:  Life alert? No  Use of a cane, walker or w/c? Yes  Grab bars in the bathroom? No  Shower chair or bench in shower? No  Elevated toilet seat or a handicapped toilet? No   TIMED UP AND GO:  Was the test performed? Yes .  Length of time to ambulate 10 feet: 10 sec.   Gait steady and fast without use of assistive device  Cognitive Function:     6CIT Screen 08/23/2020 08/08/2019  What Year? 0 points 0 points  What month? 0 points 0 points  What time? - 0 points  Count back from 20 0 points 0 points  Months in reverse 0 points 0 points  Repeat phrase 0 points 0 points  Total Score - 0    Immunizations Immunization History  Administered Date(s) Administered  . Fluad Quad(high Dose 65+) 04/19/2020  . Influenza Split 03/20/2011  . Influenza Whole 04/02/2007  . Influenza, High Dose Seasonal PF 07/15/2018, 03/20/2019  . Influenza, Seasonal, Injecte, Preservative Fre 04/26/2012  . PFIZER(Purple Top)SARS-COV-2 Vaccination 07/10/2019, 08/04/2019, 06/01/2020  . Td 05/01/2005  . Tdap 08/01/2016    TDAP status: Up to date  Flu Vaccine status: Up to  date  Pneumococcal vaccine status: Declined,  Education has been provided regarding the importance of this vaccine but patient still declined. Advised may receive this vaccine at local pharmacy or Health Dept. Aware to provide a copy of the vaccination record if obtained from local pharmacy or Health Dept. Verbalized acceptance and understanding.   Covid-19 vaccine status: Completed vaccines  Qualifies for Shingles Vaccine? Yes   Zostavax completed No   Shingrix Completed?: No.    Education has been provided regarding the importance of this vaccine. Patient has been advised to call insurance company to determine out of pocket expense if they have not yet received this vaccine. Advised may also receive vaccine at local pharmacy or Health Dept. Verbalized acceptance and understanding.  Screening Tests Health Maintenance  Topic Date Due  . Hepatitis C Screening  Never done  . PNA vac Low Risk Adult (1 of 2 - PCV13) 08/26/2020 (Originally 06/30/2018)  . TETANUS/TDAP  08/02/2026  . COLONOSCOPY (Pts 45-35yrs Insurance coverage will need to be confirmed)  05/31/2030  . INFLUENZA VACCINE  Completed  . COVID-19 Vaccine  Completed  . HPV VACCINES  Aged Out    Health Maintenance  Health Maintenance Due  Topic Date Due  . Hepatitis C Screening  Never done    Colorectal Cancer screening Pt stated cologuard 2021 with good results   Additional Screening:  Hepatitis C Screening: does qualify;  Vision Screening: Recommended annual ophthalmology exams for early detection of glaucoma and other disorders of the eye. Is the patient up to date with their annual eye exam?  Yes  Who is the provider or what is the name of the office in which the patient attends annual eye exams? Dr [facility]  If pt is not established with a provider, would they like to be referred to a provider to establish care? No .   Dental Screening: Recommended annual dental exams for proper oral  hygiene  Community Resource Referral  / Chronic Care Management: CRR required this visit?  No   CCM required this visit?  No      Plan:     I have personally reviewed and noted the following in the patient's chart:   . Medical and social history . Use of alcohol, tobacco or illicit drugs  . Current medications and supplements . Functional ability and status . Nutritional status . Physical activity . Advanced directives . List of other physicians . Hospitalizations, surgeries, and ER visits in previous 12 months . Vitals . Screenings to include cognitive, depression, and falls . Referrals and appointments  In addition, I have reviewed and discussed with patient certain preventive protocols, quality metrics, and best practice recommendations. A written personalized care plan for preventive services as well as general preventive health recommendations were provided to patient.     Willette Brace, LPN   3/72/9021   Nurse Notes: None

## 2020-08-23 NOTE — Progress Notes (Signed)
   John Bowen is a 65 y.o. male who presents today for an office visit.  Assessment/Plan:  New/Acute Problems: Cold intolerance Check labs including CBC, CMET, TSH, and B12.   Chronic Problems Addressed Today: Essential hypertension At goal per JNC 8.  Continue lisinopril 40 mg daily.  Check labs today.  Insomnia Trazodone has not been very effective.  He has also tried melatonin in combination with no significant improvement.  He has tried Ambien and hydroxyzine in the past which did not work well.  We will try Belsomra.  He will check in in a couple weeks via MyChart  Dyslipidemia Check labs.  Has not tolerated statins in the past.     Subjective:  HPI:  See A/P for status of chronic conditions  He is having more trouble with insomnia.  Trazodone helps modestly but he has been out of it for several days.  Occasionally takes melatonin as well.  He has also noticed more cold intolerance at night.  He has had more dry skin as well.  Does have a history of psoriasis and follows with dermatology for this.        Objective:  Physical Exam: BP (!) 147/83   Pulse (!) 51   Temp 97.7 F (36.5 C) (Temporal)   Ht 5\' 10"  (1.778 m)   Wt 181 lb 12.8 oz (82.5 kg)   SpO2 99%   BMI 26.09 kg/m   Wt Readings from Last 3 Encounters:  08/23/20 181 lb 12.8 oz (82.5 kg)  09/03/19 183 lb (83 kg)  08/27/19 183 lb (83 kg)  Gen: No acute distress, resting comfortably CV: Regular rate and rhythm with no murmurs appreciated Pulm: Normal work of breathing, clear to auscultation bilaterally with no crackles, wheezes, or rhonchi Neuro: Grossly normal, moves all extremities Psych: Normal affect and thought content      John Bowen M. Jerline Pain, MD 08/23/2020 8:49 AM

## 2020-08-23 NOTE — Patient Instructions (Addendum)
John Bowen , Thank you for taking time to come for your Medicare Wellness Visit. I appreciate your ongoing commitment to your health goals. Please review the following plan we discussed and let me know if I can assist you in the future.   Screening recommendations/referrals: Colonoscopy: Cologuard stated done 2021 with NL results  Recommended yearly ophthalmology/optometry visit for glaucoma screening and checkup Recommended yearly dental visit for hygiene and checkup  Vaccinations: Influenza vaccine: Up to date Pneumococcal vaccine: due and discussed Tdap vaccine: Up to date Shingles vaccine: Shingrix discussed. Please contact your pharmacy for coverage information.    Covid-19: Completed 2/11 & 08/04/19  Advanced directives: Please bring a copy of your health care power of attorney and living will to the office at your convenience.  Conditions/risks identified: Stay alive and well  Next appointment: Follow up in one year for your annual wellness visit.   Preventive Care 1 Years and Older, Male Preventive care refers to lifestyle choices and visits with your health care provider that can promote health and wellness. What does preventive care include?  A yearly physical exam. This is also called an annual well check.  Dental exams once or twice a year.  Routine eye exams. Ask your health care provider how often you should have your eyes checked.  Personal lifestyle choices, including:  Daily care of your teeth and gums.  Regular physical activity.  Eating a healthy diet.  Avoiding tobacco and drug use.  Limiting alcohol use.  Practicing safe sex.  Taking low doses of aspirin every day.  Taking vitamin and mineral supplements as recommended by your health care provider. What happens during an annual well check? The services and screenings done by your health care provider during your annual well check will depend on your age, overall health, lifestyle risk factors, and  family history of disease. Counseling  Your health care provider may ask you questions about your:  Alcohol use.  Tobacco use.  Drug use.  Emotional well-being.  Home and relationship well-being.  Sexual activity.  Eating habits.  History of falls.  Memory and ability to understand (cognition).  Work and work Statistician. Screening  You may have the following tests or measurements:  Height, weight, and BMI.  Blood pressure.  Lipid and cholesterol levels. These may be checked every 5 years, or more frequently if you are over 17 years old.  Skin check.  Lung cancer screening. You may have this screening every year starting at age 64 if you have a 30-pack-year history of smoking and currently smoke or have quit within the past 15 years.  Fecal occult blood test (FOBT) of the stool. You may have this test every year starting at age 56.  Flexible sigmoidoscopy or colonoscopy. You may have a sigmoidoscopy every 5 years or a colonoscopy every 10 years starting at age 24.  Prostate cancer screening. Recommendations will vary depending on your family history and other risks.  Hepatitis C blood test.  Hepatitis B blood test.  Sexually transmitted disease (STD) testing.  Diabetes screening. This is done by checking your blood sugar (glucose) after you have not eaten for a while (fasting). You may have this done every 1-3 years.  Abdominal aortic aneurysm (AAA) screening. You may need this if you are a current or former smoker.  Osteoporosis. You may be screened starting at age 82 if you are at high risk. Talk with your health care provider about your test results, treatment options, and if necessary, the need  for more tests. Vaccines  Your health care provider may recommend certain vaccines, such as:  Influenza vaccine. This is recommended every year.  Tetanus, diphtheria, and acellular pertussis (Tdap, Td) vaccine. You may need a Td booster every 10 years.  Zoster  vaccine. You may need this after age 73.  Pneumococcal 13-valent conjugate (PCV13) vaccine. One dose is recommended after age 37.  Pneumococcal polysaccharide (PPSV23) vaccine. One dose is recommended after age 52. Talk to your health care provider about which screenings and vaccines you need and how often you need them. This information is not intended to replace advice given to you by your health care provider. Make sure you discuss any questions you have with your health care provider. Document Released: 06/11/2015 Document Revised: 02/02/2016 Document Reviewed: 03/16/2015 Elsevier Interactive Patient Education  2017 [place] Prevention in the Home Falls can cause injuries. They can happen to people of all ages. There are many things you can do to make your home safe and to help prevent falls. What can I do on the outside of my home?  Regularly fix the edges of walkways and driveways and fix any cracks.  Remove anything that might make you trip as you walk through a door, such as a raised step or threshold.  Trim any bushes or trees on the path to your home.  Use bright outdoor lighting.  Clear any walking paths of anything that might make someone trip, such as rocks or tools.  Regularly check to see if handrails are loose or broken. Make sure that both sides of any steps have handrails.  Any raised decks and porches should have guardrails on the edges.  Have any leaves, snow, or ice cleared regularly.  Use sand or salt on walking paths during winter.  Clean up any spills in your garage right away. This includes oil or grease spills. What can I do in the bathroom?  Use night lights.  Install grab bars by the toilet and in the tub and shower. Do not use towel bars as grab bars.  Use non-skid mats or decals in the tub or shower.  If you need to sit down in the shower, use a plastic, non-slip stool.  Keep the floor dry. Clean up any water that spills on the  floor as soon as it happens.  Remove soap buildup in the tub or shower regularly.  Attach bath mats securely with double-sided non-slip rug tape.  Do not have throw rugs and other things on the floor that can make you trip. What can I do in the bedroom?  Use night lights.  Make sure that you have a light by your bed that is easy to reach.  Do not use any sheets or blankets that are too big for your bed. They should not hang down onto the floor.  Have a firm chair that has side arms. You can use this for support while you get dressed.  Do not have throw rugs and other things on the floor that can make you trip. What can I do in the kitchen?  Clean up any spills right away.  Avoid walking on wet floors.  Keep items that you use a lot in easy-to-reach places.  If you need to reach something above you, use a strong step stool that has a grab bar.  Keep electrical cords out of the way.  Do not use floor polish or wax that makes floors slippery. If you must use wax, use  non-skid floor wax.  Do not have throw rugs and other things on the floor that can make you trip. What can I do with my stairs?  Do not leave any items on the stairs.  Make sure that there are handrails on both sides of the stairs and use them. Fix handrails that are broken or loose. Make sure that handrails are as long as the stairways.  Check any carpeting to make sure that it is firmly attached to the stairs. Fix any carpet that is loose or worn.  Avoid having throw rugs at the top or bottom of the stairs. If you do have throw rugs, attach them to the floor with carpet tape.  Make sure that you have a light switch at the top of the stairs and the bottom of the stairs. If you do not have them, ask someone to add them for you. What else can I do to help prevent falls?  Wear shoes that:  Do not have high heels.  Have rubber bottoms.  Are comfortable and fit you well.  Are closed at the toe. Do not wear  sandals.  If you use a stepladder:  Make sure that it is fully opened. Do not climb a closed stepladder.  Make sure that both sides of the stepladder are locked into place.  Ask someone to hold it for you, if possible.  Clearly mark and make sure that you can see:  Any grab bars or handrails.  First and last steps.  Where the edge of each step is.  Use tools that help you move around (mobility aids) if they are needed. These include:  Canes.  Walkers.  Scooters.  Crutches.  Turn on the lights when you go into a dark area. Replace any light bulbs as soon as they burn out.  Set up your furniture so you have a clear path. Avoid moving your furniture around.  If any of your floors are uneven, fix them.  If there are any pets around you, be aware of where they are.  Review your medicines with your doctor. Some medicines can make you feel dizzy. This can increase your chance of falling. Ask your doctor what other things that you can do to help prevent falls. This information is not intended to replace advice given to you by your health care provider. Make sure you discuss any questions you have with your health care provider. Document Released: 03/11/2009 Document Revised: 10/21/2015 Document Reviewed: 06/19/2014 Elsevier Interactive Patient Education  2017 [employer].

## 2020-08-26 ENCOUNTER — Telehealth: Payer: Self-pay

## 2020-08-26 ENCOUNTER — Other Ambulatory Visit: Payer: Self-pay | Admitting: *Deleted

## 2020-08-26 DIAGNOSIS — E559 Vitamin D deficiency, unspecified: Secondary | ICD-10-CM

## 2020-08-26 DIAGNOSIS — E538 Deficiency of other specified B group vitamins: Secondary | ICD-10-CM

## 2020-08-26 MED ORDER — VITAMIN D (ERGOCALCIFEROL) 1.25 MG (50000 UNIT) PO CAPS: 50000.0000 [IU] | ORAL_CAPSULE | ORAL | 0 refills | Status: AC

## 2020-08-26 NOTE — Progress Notes (Signed)
VID

## 2020-08-26 NOTE — Telephone Encounter (Signed)
Patient called back about his lab results.

## 2020-08-26 NOTE — Telephone Encounter (Signed)
Result given, see results note

## 2020-08-26 NOTE — Telephone Encounter (Signed)
See below

## 2020-08-26 NOTE — Progress Notes (Signed)
b12

## 2020-08-31 ENCOUNTER — Other Ambulatory Visit: Payer: Self-pay

## 2020-08-31 ENCOUNTER — Ambulatory Visit (INDEPENDENT_AMBULATORY_CARE_PROVIDER_SITE_OTHER): Payer: Medicare HMO

## 2020-08-31 DIAGNOSIS — E538 Deficiency of other specified B group vitamins: Secondary | ICD-10-CM | POA: Diagnosis not present

## 2020-08-31 MED ORDER — CYANOCOBALAMIN 1000 MCG/ML IJ SOLN
1000.0000 ug | Freq: Once | INTRAMUSCULAR | Status: AC
  Administered 2020-08-31: 1000 ug via INTRAMUSCULAR

## 2020-08-31 NOTE — Progress Notes (Signed)
Per orders of Dr. Jerline Pain, injection of B-12 given by Tobe Sos in left deltoid. Patient tolerated injection well. Patient will make appointment for 1 week.

## 2020-09-07 ENCOUNTER — Other Ambulatory Visit: Payer: Self-pay

## 2020-09-07 ENCOUNTER — Ambulatory Visit (INDEPENDENT_AMBULATORY_CARE_PROVIDER_SITE_OTHER): Payer: Medicare HMO

## 2020-09-07 DIAGNOSIS — E538 Deficiency of other specified B group vitamins: Secondary | ICD-10-CM

## 2020-09-07 MED ORDER — CYANOCOBALAMIN 1000 MCG/ML IJ SOLN
1000.0000 ug | Freq: Once | INTRAMUSCULAR | Status: AC
  Administered 2020-09-07: 1000 ug via INTRAMUSCULAR

## 2020-09-07 NOTE — Progress Notes (Signed)
John Bowen 65 year old male presents in office today for B12 injection per Dimas Chyle, MD. Administered CYANOCOBALAMIN 1,021mcg IM right deltoid. Patient tolerated injection well, is aware to return next week for next injection.

## 2020-09-14 ENCOUNTER — Other Ambulatory Visit: Payer: Self-pay

## 2020-09-14 ENCOUNTER — Ambulatory Visit (INDEPENDENT_AMBULATORY_CARE_PROVIDER_SITE_OTHER): Payer: Medicare HMO

## 2020-09-14 DIAGNOSIS — E538 Deficiency of other specified B group vitamins: Secondary | ICD-10-CM

## 2020-09-14 MED ORDER — CYANOCOBALAMIN 1000 MCG/ML IJ SOLN
1000.0000 ug | Freq: Once | INTRAMUSCULAR | Status: AC
  Administered 2020-09-14: 1000 ug via INTRAMUSCULAR

## 2020-09-14 NOTE — Progress Notes (Signed)
Per orders of Dr. Jerline Pain, injection of B-12 given by Tobe Sos in left deltoid. Patient tolerated injection well. Patient will make appointment for 1 week.

## 2020-09-21 ENCOUNTER — Other Ambulatory Visit: Payer: Self-pay

## 2020-09-21 ENCOUNTER — Ambulatory Visit (INDEPENDENT_AMBULATORY_CARE_PROVIDER_SITE_OTHER): Payer: Medicare HMO

## 2020-09-21 ENCOUNTER — Ambulatory Visit: Payer: Medicare HMO

## 2020-09-21 DIAGNOSIS — E538 Deficiency of other specified B group vitamins: Secondary | ICD-10-CM

## 2020-09-21 MED ORDER — CYANOCOBALAMIN 1000 MCG/ML IJ SOLN
1000.0000 ug | Freq: Once | INTRAMUSCULAR | Status: AC
  Administered 2020-09-21: 1000 ug via INTRAMUSCULAR

## 2020-09-21 NOTE — Progress Notes (Signed)
Per orders of Dr. Parker, injection of B-12 given by Shena Vinluan D Arshdeep Bolger in right deltoid. Patient tolerated injection well. Patient will make appointment for 1 month.   

## 2020-10-14 ENCOUNTER — Ambulatory Visit: Payer: Medicare HMO | Attending: Critical Care Medicine

## 2020-10-14 ENCOUNTER — Ambulatory Visit: Payer: Medicare HMO

## 2020-10-14 DIAGNOSIS — Z20822 Contact with and (suspected) exposure to covid-19: Secondary | ICD-10-CM | POA: Diagnosis not present

## 2020-10-15 LAB — NOVEL CORONAVIRUS, NAA: SARS-CoV-2, NAA: DETECTED — AB

## 2020-10-15 LAB — SARS-COV-2, NAA 2 DAY TAT

## 2021-01-28 DIAGNOSIS — Z96652 Presence of left artificial knee joint: Secondary | ICD-10-CM | POA: Diagnosis not present

## 2021-02-01 ENCOUNTER — Other Ambulatory Visit (HOSPITAL_COMMUNITY): Payer: Self-pay | Admitting: Orthopedic Surgery

## 2021-02-01 ENCOUNTER — Other Ambulatory Visit: Payer: Self-pay | Admitting: Orthopedic Surgery

## 2021-02-01 DIAGNOSIS — Z96652 Presence of left artificial knee joint: Secondary | ICD-10-CM

## 2021-02-08 ENCOUNTER — Encounter (HOSPITAL_COMMUNITY)
Admission: RE | Admit: 2021-02-08 | Discharge: 2021-02-08 | Disposition: A | Discharge status: Home / Self Care | Payer: Medicare HMO | Source: Ambulatory Visit | Attending: Orthopedic Surgery | Admitting: Orthopedic Surgery

## 2021-02-08 ENCOUNTER — Other Ambulatory Visit: Payer: Self-pay

## 2021-02-08 DIAGNOSIS — Z96652 Presence of left artificial knee joint: Secondary | ICD-10-CM | POA: Diagnosis not present

## 2021-02-08 DIAGNOSIS — M7989 Other specified soft tissue disorders: Secondary | ICD-10-CM | POA: Diagnosis not present

## 2021-02-08 DIAGNOSIS — Z471 Aftercare following joint replacement surgery: Secondary | ICD-10-CM | POA: Diagnosis not present

## 2021-02-08 DIAGNOSIS — M25562 Pain in left knee: Secondary | ICD-10-CM | POA: Diagnosis not present

## 2021-02-08 DIAGNOSIS — R948 Abnormal results of function studies of other organs and systems: Secondary | ICD-10-CM | POA: Diagnosis not present

## 2021-02-08 MED ORDER — TECHNETIUM TC 99M MEDRONATE IV KIT
20.0000 | PACK | Freq: Once | INTRAVENOUS | Status: AC | PRN
  Administered 2021-02-08: 20 via INTRAVENOUS

## 2021-03-02 DIAGNOSIS — Z96652 Presence of left artificial knee joint: Secondary | ICD-10-CM | POA: Diagnosis not present

## 2021-03-09 DIAGNOSIS — Z96652 Presence of left artificial knee joint: Secondary | ICD-10-CM | POA: Diagnosis not present

## 2021-03-28 ENCOUNTER — Other Ambulatory Visit: Payer: Self-pay | Admitting: Family Medicine

## 2021-05-04 ENCOUNTER — Ambulatory Visit (INDEPENDENT_AMBULATORY_CARE_PROVIDER_SITE_OTHER): Payer: Medicare HMO

## 2021-05-04 ENCOUNTER — Telehealth: Payer: Self-pay

## 2021-05-04 ENCOUNTER — Other Ambulatory Visit: Payer: Self-pay

## 2021-05-04 DIAGNOSIS — Z23 Encounter for immunization: Secondary | ICD-10-CM

## 2021-05-04 NOTE — Telephone Encounter (Signed)
Please advise 

## 2021-05-04 NOTE — Telephone Encounter (Signed)
Patient was here in the office today and said when he had covid he was advised to stop the Vitamin b12 shots but wasn't advised on how long to stop them. John Bowen is wondering if he can pick back up on that or if he needs to have another appt.

## 2021-05-05 ENCOUNTER — Other Ambulatory Visit: Payer: Self-pay | Admitting: *Deleted

## 2021-05-05 DIAGNOSIS — E559 Vitamin D deficiency, unspecified: Secondary | ICD-10-CM

## 2021-05-05 DIAGNOSIS — E538 Deficiency of other specified B group vitamins: Secondary | ICD-10-CM

## 2021-05-05 NOTE — Telephone Encounter (Signed)
We need to recheck his B12. Please have him come in for a lab visit.  Algis Greenhouse. Jerline Pain, MD 05/05/2021 9:11 AM

## 2021-05-05 NOTE — Telephone Encounter (Signed)
Patient advised of B-12 recheck and scheduled appt for patient.   Witnessed by Rhett Bannister

## 2021-05-06 ENCOUNTER — Other Ambulatory Visit (INDEPENDENT_AMBULATORY_CARE_PROVIDER_SITE_OTHER): Payer: Medicare HMO

## 2021-05-06 ENCOUNTER — Other Ambulatory Visit: Payer: Self-pay

## 2021-05-06 ENCOUNTER — Other Ambulatory Visit: Payer: Medicare HMO

## 2021-05-06 DIAGNOSIS — E538 Deficiency of other specified B group vitamins: Secondary | ICD-10-CM

## 2021-05-06 DIAGNOSIS — E559 Vitamin D deficiency, unspecified: Secondary | ICD-10-CM | POA: Diagnosis not present

## 2021-05-06 LAB — VITAMIN D 25 HYDROXY (VIT D DEFICIENCY, FRACTURES): VITD: 31.25 ng/mL (ref 30.00–100.00)

## 2021-05-06 LAB — VITAMIN B12: Vitamin B-12: 248 pg/mL (ref 211–911)

## 2021-05-09 NOTE — Progress Notes (Signed)
Please inform patient of the following:  His vitamin D and b12 are better but still on the lower side. He should continue 1000IU vitamin D daily and 1057mcg B12 daily and we can recheck next time he comes in for blood work.  Algis Greenhouse. Jerline Pain, MD 05/09/2021 8:08 AM

## 2021-06-16 DIAGNOSIS — Z96652 Presence of left artificial knee joint: Secondary | ICD-10-CM | POA: Diagnosis not present

## 2021-06-26 ENCOUNTER — Other Ambulatory Visit: Payer: Self-pay | Admitting: Family Medicine

## 2021-07-20 DIAGNOSIS — M65862 Other synovitis and tenosynovitis, left lower leg: Secondary | ICD-10-CM | POA: Diagnosis not present

## 2021-07-20 DIAGNOSIS — Z96652 Presence of left artificial knee joint: Secondary | ICD-10-CM | POA: Diagnosis not present

## 2021-07-20 DIAGNOSIS — M67252 Synovial hypertrophy, not elsewhere classified, left thigh: Secondary | ICD-10-CM | POA: Diagnosis not present

## 2021-07-26 HISTORY — PX: KNEE ARTHROSCOPY: SUR90

## 2021-08-01 ENCOUNTER — Encounter: Payer: Self-pay | Admitting: Family Medicine

## 2021-08-01 ENCOUNTER — Ambulatory Visit (INDEPENDENT_AMBULATORY_CARE_PROVIDER_SITE_OTHER): Payer: Medicare HMO | Admitting: Family Medicine

## 2021-08-01 VITALS — BP 140/90 | HR 70 | Temp 97.9°F | Ht 70.0 in | Wt 178.5 lb

## 2021-08-01 DIAGNOSIS — J0100 Acute maxillary sinusitis, unspecified: Secondary | ICD-10-CM | POA: Diagnosis not present

## 2021-08-01 LAB — POC COVID19 BINAXNOW: SARS Coronavirus 2 Ag: NEGATIVE

## 2021-08-01 MED ORDER — AZITHROMYCIN 250 MG PO TABS: ORAL_TABLET | ORAL | 0 refills | Status: AC

## 2021-08-01 NOTE — Progress Notes (Signed)
? ?Subjective:  ? ? ? Patient ID: John Bowen, male    DOB: 04-07-1956, 66 y.o.   MRN: [id number] ? ?Chief Complaint  ?Patient presents with  ? Sinus Problem  ?  Nasal drainage ?Sx started last Tuesday ?Took both day and nighttime Mucinex ? ?  ? Cough  ?  Productive cough ?  ? ? ?HPI ?Chief complaint: congestion/cough ?Symptom onset: 1 wk ?Pertinent positives:Had L knee arth 2/28. Next day, nasal drainage, productive cough. Sinuses tender. Couging a lot in am from drainage.  Big "globs" of snot in am.   ?Pertinent negatives: no f/c/sob. ?Treatments tried: OTC ?Vaccine status: UTD ?Sick exposure: none  ? ?Health Maintenance Due  ?Topic Date Due  ? Hepatitis C Screening  Never done  ? Zoster Vaccines- Shingrix (1 of 2) Never done  ? Pneumonia Vaccine 34+ Years old (1 - PCV) Never done  ? COVID-19 Vaccine (4 - Booster for Pfizer series) 07/27/2020  ? ? ?Past Medical History:  ?Diagnosis Date  ? Asthma   ? as a child  ? Complication of anesthesia   ? DJD (degenerative joint disease)   ? knees  ? Dysfunction of sleep stage or arousal   ? Hypertension   ? Meningeal disorder   ? Meningitis   ? Zoster  ? PONV (postoperative nausea and vomiting)   ? vomiting when got home after arthroscopic  knee surgery  ? Shingles   ? Sleep apnea   ? does not tolerate cpap  ? ? ?Past Surgical History:  ?Procedure Laterality Date  ? arthroscopic knee    ? right  ? FRACTURE SURGERY    ?   Left wrist and right hand  ? JOINT REPLACEMENT    ? left knee 1979 , left knee 2018  ? KNEE ARTHROSCOPY Left 07/26/2021  ? scar tissue from prev surgeries  ? TOTAL KNEE REVISION Left 07/31/2018  ? Procedure: Left knee polyethylene revision;  Surgeon: Gaynelle Arabian, MD;  Location: [facility];  Service: Orthopedics;  Laterality: Left;  65mn  ? ? ?Outpatient Medications Prior to Visit  ?Medication Sig Dispense Refill  ? aspirin 81 MG EC tablet aspirin 81 mg tablet,delayed release ? 1 tablet by mouth once a day for three weeks following surgery    ?  hydrocortisone 2.5 % cream Apply 1 application topically daily as needed (itching).    ? lisinopril (ZESTRIL) 40 MG tablet Take 1 tablet by mouth once daily 90 tablet 0  ? PFIZER-BIONTECH COVID-19 VACC 30 MCG/0.3ML injection     ? traZODone (DESYREL) 100 MG tablet Take 1.5 tablets (150 mg total) by mouth at bedtime. 135 tablet 3  ? Vitamin D, Ergocalciferol, (DRISDOL) 1.25 MG (50000 UNIT) CAPS capsule Take 1 capsule (50,000 Units total) by mouth every 7 (seven) days. 12 capsule 0  ? Suvorexant (BELSOMRA) 10 MG TABS Take 10 mg by mouth at bedtime. (Patient not taking: Reported on 08/01/2021) 30 tablet 5  ? ?No facility-administered medications prior to visit.  ? ? ?Allergies  ?Allergen Reactions  ? Penicillins   ? ?ROS neg/noncontributory except as noted HPI/below ? ? ?   ?Objective:  ?  ? ?BP 140/90   Pulse 70   Temp 97.9 ?F (36.6 ?C) (Temporal)   Ht '5\' 10"'$  (1.778 m)   Wt 178 lb 8 oz (81 kg)   SpO2 96%   BMI 25.61 kg/m?  ?Wt Readings from Last 3 Encounters:  ?08/01/21 178 lb 8 oz (81 kg)  ?08/23/20 181 lb (  82.1 kg)  ?08/23/20 181 lb 12.8 oz (82.5 kg)  ? ? ?   ? ?Gen: WDWN NAD WM ?HEENT: NCAT, conjunctiva not injected, sclera nonicteric ?TM WNL B, OP moist, no exudates  ?NECK:  supple, no thyromegaly, no nodes, no carotid bruits ?CARDIAC: RRR, S1S2+, no murmur. ?LUNGS: CTAB. No wheezes ?EXT:  no edema ?MSK: no gross abnormalities.  ?NEURO: A&O x3.  CN II-XII intact.  ?PSYCH: normal mood. Good eye contact ? ?Results for orders placed or performed in visit on 08/01/21  ?Bruno COVID-19  ?Result Value Ref Range  ? SARS Coronavirus 2 Ag Negative Negative  ?  ? ?Assessment & Plan:  ? ?Problem List Items Addressed This Visit   ?None ?Visit Diagnoses   ? ? Acute non-recurrent maxillary sinusitis    -  Primary  ? Relevant Medications  ? azithromycin (ZITHROMAX) 250 MG tablet  ? ?  ? Sinusitis-zpk.  To hold.  Symptomatic tx ? ?Meds ordered this encounter  ?Medications  ? azithromycin (ZITHROMAX) 250 MG tablet  ?  Sig: Take 2  tablets on day 1, then 1 tablet daily on days 2 through 5  ?  Dispense:  6 tablet  ?  Refill:  0  ? ? ?Wellington Hampshire, MD ? ?

## 2021-08-01 NOTE — Patient Instructions (Signed)
Meds have been sent the the pharmacy °You can take tylenol for pain/fevers °If worsening symptoms, let us know or go to the Emergency room  ° ° °

## 2021-08-09 DIAGNOSIS — Z4789 Encounter for other orthopedic aftercare: Secondary | ICD-10-CM | POA: Diagnosis not present

## 2021-08-13 ENCOUNTER — Other Ambulatory Visit: Payer: Self-pay | Admitting: Family Medicine

## 2021-08-29 ENCOUNTER — Ambulatory Visit (INDEPENDENT_AMBULATORY_CARE_PROVIDER_SITE_OTHER): Payer: Medicare HMO

## 2021-08-29 VITALS — BP 138/72 | HR 59 | Temp 97.9°F | Wt 180.2 lb

## 2021-08-29 DIAGNOSIS — Z0000 Encounter for general adult medical examination without abnormal findings: Secondary | ICD-10-CM

## 2021-08-29 NOTE — Patient Instructions (Signed)
John Bowen , ?Thank you for taking time to come for your Medicare Wellness Visit. I appreciate your ongoing commitment to your health goals. Please review the following plan we discussed and let me know if I can assist you in the future.  ? ?Screening recommendations/referrals: ?Colonoscopy: Done 05/31/20 repeat every 10 years  ?Recommended yearly ophthalmology/optometry visit for glaucoma screening and checkup ?Recommended yearly dental visit for hygiene and checkup ? ?Vaccinations: ?Influenza vaccine: Done 05/04/21 repeat every year  ?Pneumococcal vaccine: Due and discussed  ?Tdap vaccine: Done 08/01/16 repeat every 10 yeas  ?Shingles vaccine: Shingrix discussed. Please contact your pharmacy for coverage information.    ?Covid-19: Completed 2/11, 08/04/19 & 06/11/20 ? ?Advanced directives: Please bring a copy of your health care power of attorney and living will to the office at your convenience. ? ? ?Conditions/risks identified: Get Knee working better  ? ?Next appointment: Follow up in one year for your annual wellness visit.  ? ?Preventive Care 68 Years and Older, Male ?Preventive care refers to lifestyle choices and visits with your health care provider that can promote health and wellness. ?What does preventive care include? ?A yearly physical exam. This is also called an annual well check. ?Dental exams once or twice a year. ?Routine eye exams. Ask your health care provider how often you should have your eyes checked. ?Personal lifestyle choices, including: ?Daily care of your teeth and gums. ?Regular physical activity. ?Eating a healthy diet. ?Avoiding tobacco and drug use. ?Limiting alcohol use. ?Practicing safe sex. ?Taking low doses of aspirin every day. ?Taking vitamin and mineral supplements as recommended by your health care provider. ?What happens during an annual well check? ?The services and screenings done by your health care provider during your annual well check will depend on your age, overall health,  lifestyle risk factors, and family history of disease. ?Counseling  ?Your health care provider may ask you questions about your: ?Alcohol use. ?Tobacco use. ?Drug use. ?Emotional well-being. ?Home and relationship well-being. ?Sexual activity. ?Eating habits. ?History of falls. ?Memory and ability to understand (cognition). ?Work and work Statistician. ?Screening  ?You may have the following tests or measurements: ?Height, weight, and BMI. ?Blood pressure. ?Lipid and cholesterol levels. These may be checked every 5 years, or more frequently if you are over 90 years old. ?Skin check. ?Lung cancer screening. You may have this screening every year starting at age 68 if you have a 30-pack-year history of smoking and currently smoke or have quit within the past 15 years. ?Fecal occult blood test (FOBT) of the stool. You may have this test every year starting at age 68. ?Flexible sigmoidoscopy or colonoscopy. You may have a sigmoidoscopy every 5 years or a colonoscopy every 10 years starting at age 68. ?Prostate cancer screening. Recommendations will vary depending on your family history and other risks. ?Hepatitis C blood test. ?Hepatitis B blood test. ?Sexually transmitted disease (STD) testing. ?Diabetes screening. This is done by checking your blood sugar (glucose) after you have not eaten for a while (fasting). You may have this done every 1-3 years. ?Abdominal aortic aneurysm (AAA) screening. You may need this if you are a current or former smoker. ?Osteoporosis. You may be screened starting at age 21 if you are at high risk. ?Talk with your health care provider about your test results, treatment options, and if necessary, the need for more tests. ?Vaccines  ?Your health care provider may recommend certain vaccines, such as: ?Influenza vaccine. This is recommended every year. ?Tetanus, diphtheria, and acellular  pertussis (Tdap, Td) vaccine. You may need a Td booster every 10 years. ?Zoster vaccine. You may need this  after age 50. ?Pneumococcal 13-valent conjugate (PCV13) vaccine. One dose is recommended after age 67. ?Pneumococcal polysaccharide (PPSV23) vaccine. One dose is recommended after age 32. ?Talk to your health care provider about which screenings and vaccines you need and how often you need them. ?This information is not intended to replace advice given to you by your health care provider. Make sure you discuss any questions you have with your health care provider. ?Document Released: 06/11/2015 Document Revised: 02/02/2016 Document Reviewed: 03/16/2015 ?Elsevier Interactive Patient Education ? 2017 [place]. ? ?Fall Prevention in the Home ?Falls can cause injuries. They can happen to people of all ages. There are many things you can do to make your home safe and to help prevent falls. ?What can I do on the outside of my home? ?Regularly fix the edges of walkways and driveways and fix any cracks. ?Remove anything that might make you trip as you walk through a door, such as a raised step or threshold. ?Trim any bushes or trees on the path to your home. ?Use bright outdoor lighting. ?Clear any walking paths of anything that might make someone trip, such as rocks or tools. ?Regularly check to see if handrails are loose or broken. Make sure that both sides of any steps have handrails. ?Any raised decks and porches should have guardrails on the edges. ?Have any leaves, snow, or ice cleared regularly. ?Use sand or salt on walking paths during winter. ?Clean up any spills in your garage right away. This includes oil or grease spills. ?What can I do in the bathroom? ?Use night lights. ?Install grab bars by the toilet and in the tub and shower. Do not use towel bars as grab bars. ?Use non-skid mats or decals in the tub or shower. ?If you need to sit down in the shower, use a plastic, non-slip stool. ?Keep the floor dry. Clean up any water that spills on the floor as soon as it happens. ?Remove soap buildup in the tub or  shower regularly. ?Attach bath mats securely with double-sided non-slip rug tape. ?Do not have throw rugs and other things on the floor that can make you trip. ?What can I do in the bedroom? ?Use night lights. ?Make sure that you have a light by your bed that is easy to reach. ?Do not use any sheets or blankets that are too big for your bed. They should not hang down onto the floor. ?Have a firm chair that has side arms. You can use this for support while you get dressed. ?Do not have throw rugs and other things on the floor that can make you trip. ?What can I do in the kitchen? ?Clean up any spills right away. ?Avoid walking on wet floors. ?Keep items that you use a lot in easy-to-reach places. ?If you need to reach something above you, use a strong step stool that has a grab bar. ?Keep electrical cords out of the way. ?Do not use floor polish or wax that makes floors slippery. If you must use wax, use non-skid floor wax. ?Do not have throw rugs and other things on the floor that can make you trip. ?What can I do with my stairs? ?Do not leave any items on the stairs. ?Make sure that there are handrails on both sides of the stairs and use them. Fix handrails that are broken or loose. Make sure that handrails  are as long as the stairways. ?Check any carpeting to make sure that it is firmly attached to the stairs. Fix any carpet that is loose or worn. ?Avoid having throw rugs at the top or bottom of the stairs. If you do have throw rugs, attach them to the floor with carpet tape. ?Make sure that you have a light switch at the top of the stairs and the bottom of the stairs. If you do not have them, ask someone to add them for you. ?What else can I do to help prevent falls? ?Wear shoes that: ?Do not have high heels. ?Have rubber bottoms. ?Are comfortable and fit you well. ?Are closed at the toe. Do not wear sandals. ?If you use a stepladder: ?Make sure that it is fully opened. Do not climb a closed stepladder. ?Make  sure that both sides of the stepladder are locked into place. ?Ask someone to hold it for you, if possible. ?Clearly mark and make sure that you can see: ?Any grab bars or handrails. ?First and last steps

## 2021-08-29 NOTE — Progress Notes (Signed)
? ?Subjective:  ? John Bowen is a 66 y.o. male who presents for Medicare Annual/Subsequent preventive examination. ? ?Review of Systems    ? ?Cardiac Risk Factors include: advanced age (>50mn, >>47women);dyslipidemia;male gender;hypertension ? ?   ?Objective:  ?  ?Today's Vitals  ? 08/29/21 0806  ?BP: 138/72  ?Pulse: (!) 59  ?Temp: 97.9 ?F (36.6 ?C)  ?SpO2: 98%  ?Weight: 180 lb 3.2 oz (81.7 kg)  ?PainSc: 4   ? ?Body mass index is 25.86 kg/m?. ? ? ?  08/29/2021  ?  8:13 AM 08/08/2019  ?  3:33 PM 07/31/2018  ? 10:55 AM 07/24/2018  ?  9:31 AM  ?Advanced Directives  ?Does Patient Have a Medical Advance Directive? Yes No No No  ?Type of AParamedicof Attorney     ?Copy of [place] Chart? No - copy requested     ?Would patient like information on creating a medical advance directive?  Yes (MAU/Ambulatory/Procedural Areas - Information given) No - Patient declined No - Patient declined  ? ? ?Current Medications (verified) ?Outpatient Encounter Medications as of 08/29/2021  ?Medication Sig  ? aspirin 81 MG EC tablet aspirin 81 mg tablet,delayed release ? 1 tablet by mouth once a day for three weeks following surgery  ? hydrocortisone 2.5 % cream Apply 1 application topically daily as needed (itching).  ? lisinopril (ZESTRIL) 40 MG tablet Take 1 tablet by mouth once daily  ? traZODone (DESYREL) 100 MG tablet TAKE 1 & 1/2 (ONE & ONE-HALF) TABLETS BY MOUTH AT BEDTIME  ? Vitamin D, Ergocalciferol, (DRISDOL) 1.25 MG (50000 UNIT) CAPS capsule Take 1 capsule (50,000 Units total) by mouth every 7 (seven) days.  ? PFIZER-BIONTECH COVID-19 VACC 30 MCG/0.3ML injection   ? ?No facility-administered encounter medications on file as of 08/29/2021.  ? ? ?Allergies (verified) ?Penicillins  ? ?History: ?Past Medical History:  ?Diagnosis Date  ? Asthma   ? as a child  ? Complication of anesthesia   ? DJD (degenerative joint disease)   ? knees  ? Dysfunction of sleep stage or arousal   ?  Hypertension   ? Meningeal disorder   ? Meningitis   ? Zoster  ? PONV (postoperative nausea and vomiting)   ? vomiting when got home after arthroscopic  knee surgery  ? Shingles   ? Sleep apnea   ? does not tolerate cpap  ? ?Past Surgical History:  ?Procedure Laterality Date  ? arthroscopic knee    ? right  ? FRACTURE SURGERY    ?   Left wrist and right hand  ? JOINT REPLACEMENT    ? left knee 1979 , left knee 2018  ? KNEE ARTHROSCOPY Left 07/26/2021  ? scar tissue from prev surgeries  ? TOTAL KNEE REVISION Left 07/31/2018  ? Procedure: Left knee polyethylene revision;  Surgeon: AGaynelle Arabian MD;  Location: [facility];  Service: Orthopedics;  Laterality: Left;  668m  ? ?Family History  ?Problem Relation Age of Onset  ? Hypertension Other   ? ?Social History  ? ?Socioeconomic History  ? Marital status: Married  ?  Spouse name: Not on file  ? Number of children: 1  ? Years of education: Not on file  ? Highest education level: Not on file  ?Occupational History  ? Occupation: Retired   ?Tobacco Use  ? Smoking status: Never  ? Smokeless tobacco: Never  ?Vaping Use  ? Vaping Use: Never used  ?Substance and Sexual Activity  ?  Alcohol use: Yes  ?  Alcohol/week: 3.0 standard drinks  ?  Types: 3 Cans of beer per week  ?  Comment: a week  ? Drug use: No  ? Sexual activity: Yes  ?Other Topics Concern  ? Not on file  ?Social History Narrative  ? 1 Son   ? Lives with spouse   ? ?Social Determinants of Health  ? ?Financial Resource Strain: Low Risk   ? Difficulty of Paying Living Expenses: Not hard at all  ?Food Insecurity: No Food Insecurity  ? Worried About Charity fundraiser in the Last Year: Never true  ? Ran Out of Food in the Last Year: Never true  ?Transportation Needs: No Transportation Needs  ? Lack of Transportation (Medical): No  ? Lack of Transportation (Non-Medical): No  ?Physical Activity: Insufficiently Active  ? Days of Exercise per Week: 3 days  ? Minutes of Exercise per Session: 20 min  ?Stress: No Stress  Concern Present  ? Feeling of Stress : Not at all  ?Social Connections: Socially Integrated  ? Frequency of Communication with Friends and Family: Twice a week  ? Frequency of Social Gatherings with Friends and Family: More than three times a week  ? Attends Religious Services: 1 to 4 times per year  ? Active Member of Clubs or Organizations: Yes  ? Attends Archivist Meetings: 1 to 4 times per year  ? Marital Status: Married  ? ? ?Tobacco Counseling ?Counseling given: Not Answered ? ? ?Clinical Intake: ? ?Pre-visit preparation completed: Yes ? ?Pain : 0-10 ?Pain Score: 4  ?Pain Type: Chronic pain ?Pain Location: Knee ?Pain Orientation: Left ?Pain Descriptors / Indicators: Aching, Sore ?Pain Onset: More than a month ago ?Pain Frequency: Constant ? ?  ? ?BMI - recorded: 25.86 ?Nutritional Status: BMI 25 -29 Overweight ?Nutritional Risks: None ?Diabetes: No ? ?How often do you need to have someone help you when you read instructions, pamphlets, or other written materials from your doctor or pharmacy?: 1 - Never ? ?Diabetic?No ? ?Interpreter Needed?: No ? ?Information entered by :: Charlott Rakes, LPN ? ? ?Activities of Daily Living ? ?  08/29/2021  ?  8:15 AM  ?In your present state of health, do you have any difficulty performing the following activities:  ?Hearing? 0  ?Vision? 0  ?Difficulty concentrating or making decisions? 0  ?Walking or climbing stairs? 1  ?Comment down stairs difficult  ?Dressing or bathing? 0  ?Doing errands, shopping? 0  ?Preparing Food and eating ? N  ?Using the Toilet? N  ?In the past six months, have you accidently leaked urine? N  ?Do you have problems with loss of bowel control? N  ?Managing your Medications? N  ?Managing your Finances? N  ?Housekeeping or managing your Housekeeping? N  ? ? ?Patient Care Team: ?Vivi Barrack, MD as PCP - General (Family Medicine) ?Gaynelle Arabian, MD as Consulting Physician (Orthopedic Surgery) ? ?Indicate any recent Medical Services you may  have received from other than [facility] providers in the past year (date may be approximate). ? ?   ?Assessment:  ? This is a routine wellness examination for John Bowen. ? ?Hearing/Vision screen ?Hearing Screening - Comments:: Pt denies any hearing issues  ?Vision Screening - Comments:: Pt follows up with [facility] for annual eye exams ? ?Dietary issues and exercise activities discussed: ?Current Exercise Habits: Home exercise routine, Type of exercise: walking, Time (Minutes): 20, Frequency (Times/Week): 4, Weekly Exercise (Minutes/Week): 80 ? ? Goals Addressed   ? ?  ?  ?  ?  ?  This Visit's Progress  ?  Patient Stated     ?  Working on knee to get better  ?  ? ?  ? ?Depression Screen ? ?  08/29/2021  ?  8:12 AM 08/23/2020  ?  9:36 AM 08/23/2020  ?  8:20 AM 08/08/2019  ?  3:34 PM 07/15/2018  ?  1:17 PM  ?PHQ 2/9 Scores  ?PHQ - 2 Score 0 0 0 0 0  ?  ?Fall Risk ? ?  08/29/2021  ?  8:14 AM 08/23/2020  ?  9:39 AM 08/27/2019  ?  8:16 AM 08/08/2019  ?  3:34 PM  ?Fall Risk   ?Falls in the past year? 0 0 0 0  ?Number falls in past yr: 0 0  0  ?Injury with Fall? 0 0  0  ?Risk for fall due to : Impaired vision Impaired vision  Orthopedic patient  ?Follow up Falls prevention discussed Falls prevention discussed  Falls evaluation completed;Education provided;Falls prevention discussed  ? ? ?FALL RISK PREVENTION PERTAINING TO THE HOME: ? ?Any stairs in or around the home? Yes  ?If so, are there any without handrails? No  ?Home free of loose throw rugs in walkways, pet beds, electrical cords, etc? Yes  ?Adequate lighting in your home to reduce risk of falls? Yes  ? ?ASSISTIVE DEVICES UTILIZED TO PREVENT FALLS: ? ?Life alert? No  ?Use of a cane, walker or w/c? No  ?Grab bars in the bathroom? No  ?Shower chair or bench in shower? No  ?Elevated toilet seat or a handicapped toilet? No  ? ?TIMED UP AND GO: ? ?Was the test performed? Yes .  ?Length of time to ambulate 10 feet: 10 sec.  ? ?Gait steady and fast without use of assistive  device ? ?Cognitive Function: ?  ?  ? ?  08/29/2021  ?  8:16 AM 08/23/2020  ?  9:43 AM 08/08/2019  ?  3:34 PM  ?6CIT Screen  ?What Year? 0 points 0 points 0 points  ?What month? 0 points 0 points 0 points  ?What time? 0 points  0 points  ?

## 2021-09-26 ENCOUNTER — Other Ambulatory Visit: Payer: Self-pay | Admitting: Family Medicine

## 2021-10-13 DIAGNOSIS — Z96652 Presence of left artificial knee joint: Secondary | ICD-10-CM | POA: Diagnosis not present

## 2021-10-13 DIAGNOSIS — Z4789 Encounter for other orthopedic aftercare: Secondary | ICD-10-CM | POA: Diagnosis not present

## 2021-10-27 DIAGNOSIS — Z96652 Presence of left artificial knee joint: Secondary | ICD-10-CM | POA: Diagnosis not present

## 2021-10-27 DIAGNOSIS — M25662 Stiffness of left knee, not elsewhere classified: Secondary | ICD-10-CM | POA: Diagnosis not present

## 2021-10-27 DIAGNOSIS — M25562 Pain in left knee: Secondary | ICD-10-CM | POA: Diagnosis not present

## 2021-11-19 ENCOUNTER — Other Ambulatory Visit: Payer: Self-pay | Admitting: Family Medicine

## 2021-12-16 ENCOUNTER — Other Ambulatory Visit: Payer: Self-pay | Admitting: Family Medicine

## 2022-02-08 ENCOUNTER — Telehealth: Payer: Self-pay | Admitting: *Deleted

## 2022-02-08 NOTE — Patient Outreach (Signed)
  Care Coordination   02/08/2022 Name: John Bowen MRN: [id number] DOB: [birth year]   Care Coordination Outreach Attempts:  An unsuccessful telephone outreach was attempted today to offer the patient information about available care coordination services as a benefit of their health plan.   Follow Up Plan:  Additional outreach attempts will be made to offer the patient care coordination information and services.   Encounter Outcome:  No Answer  Care Coordination Interventions Activated:  No   Care Coordination Interventions:  No, not indicated    Raina Mina, RN Care Management Coordinator [place] Office [phone]

## 2022-02-09 ENCOUNTER — Other Ambulatory Visit: Payer: Self-pay | Admitting: Family Medicine

## 2022-02-20 ENCOUNTER — Encounter: Payer: Self-pay | Admitting: *Deleted

## 2022-02-23 ENCOUNTER — Telehealth: Payer: Self-pay

## 2022-02-23 NOTE — Patient Outreach (Signed)
  Care Coordination   02/23/2022 Name: John Bowen MRN: [id number] DOB: 30-Apr-1956   Care Coordination Outreach Attempts:  A second unsuccessful outreach was attempted today to offer the patient with information about available care coordination services as a benefit of their health plan.     Follow Up Plan:  Additional outreach attempts will be made to offer the patient care coordination information and services.   Encounter Outcome:  No Answer  Care Coordination Interventions Activated:  No   Care Coordination Interventions:  No, not indicated    Peter Garter RN, BSN,CCM, [facility] [phone]

## 2022-02-28 ENCOUNTER — Telehealth: Payer: Self-pay | Admitting: *Deleted

## 2022-02-28 NOTE — Patient Outreach (Signed)
  Care Coordination   Initial Visit Note   02/28/2022 Name: John Bowen MRN: [id number] DOB: 09/17/1955  John Bowen is a 66 y.o. year old male who sees Vivi Barrack, MD for primary care. I spoke with  Sidney Ace by phone today.  What matters to the patients health and wellness today?  na    Goals Addressed   None     SDOH assessments and interventions completed:  No     Care Coordination Interventions Activated:  No  Care Coordination Interventions:  No, not indicated   Follow up plan: No further intervention required.   Encounter Outcome:  Pt. Refused Declined HIPAA  Raina Mina, RN Care Management Coordinator [place] Office [phone]

## 2022-03-11 ENCOUNTER — Other Ambulatory Visit: Payer: Self-pay | Admitting: Family Medicine

## 2022-05-11 ENCOUNTER — Encounter: Payer: Self-pay | Admitting: *Deleted

## 2022-05-15 ENCOUNTER — Other Ambulatory Visit: Payer: Self-pay | Admitting: Family Medicine

## 2022-06-12 ENCOUNTER — Other Ambulatory Visit: Payer: Self-pay | Admitting: Family Medicine

## 2022-06-18 ENCOUNTER — Other Ambulatory Visit: Payer: Self-pay | Admitting: Family Medicine

## 2022-06-20 ENCOUNTER — Other Ambulatory Visit: Payer: Self-pay | Admitting: *Deleted

## 2022-06-20 ENCOUNTER — Telehealth: Payer: Self-pay | Admitting: Family Medicine

## 2022-06-20 MED ORDER — TRAZODONE HCL 100 MG PO TABS: ORAL_TABLET | ORAL | 0 refills | Status: DC

## 2022-06-20 NOTE — Telephone Encounter (Signed)
Last OV: 08/01/21  Next OV: 07/11/22  Last filled: 05/15/22  Quantity: 60 tablets w/ 0 refills

## 2022-06-20 NOTE — Telephone Encounter (Signed)
  Encourage patient to contact the pharmacy for refills or they can request refills through [facility]:  Please schedule appointment if longer than 1 year  NEXT APPOINTMENT DATE: 07/11/22  MEDICATION:  traZODone (DESYREL) 100 MG tablet    Is the patient out of medication? YES  PHARMACY:  [facility] [address] [place] - [place] [address] Phone: [phone]  Fax: [phone]      Let patient know to contact pharmacy at the end of the day to make sure medication is ready.  Please notify patient to allow 48-72 hours to process   HE HAS AN APPT FOR A PHYSICAL IN FEBRUARY AND JUST NEEDS ENOUGH TO GET HIM TO HIS APPT DATE. please ADVISE

## 2022-06-20 NOTE — Telephone Encounter (Signed)
Ok with me. Please place any necessary orders. 

## 2022-06-20 NOTE — Telephone Encounter (Signed)
Rx send to [facility]

## 2022-06-29 ENCOUNTER — Encounter: Payer: Self-pay | Admitting: Internal Medicine

## 2022-06-29 ENCOUNTER — Ambulatory Visit (INDEPENDENT_AMBULATORY_CARE_PROVIDER_SITE_OTHER): Payer: Medicare HMO | Admitting: Internal Medicine

## 2022-06-29 ENCOUNTER — Other Ambulatory Visit: Payer: Self-pay | Admitting: Internal Medicine

## 2022-06-29 VITALS — BP 138/90 | HR 60 | Temp 98.4°F | Ht 70.0 in | Wt 188.0 lb

## 2022-06-29 DIAGNOSIS — L710 Perioral dermatitis: Secondary | ICD-10-CM

## 2022-06-29 MED ORDER — NYSTATIN-TRIAMCINOLONE 100000-0.1 UNIT/GM-% EX OINT: 1.0000 | TOPICAL_OINTMENT | Freq: Two times a day (BID) | CUTANEOUS | 0 refills | Status: DC

## 2022-06-29 NOTE — Progress Notes (Signed)
[facility]: [phone]   Routine Medical Office Visit  Patient:  John Bowen      Age: 67 y.o.       Sex:  male  Date:   06/29/2022  PCP:    Vivi Barrack, [place] Provider: Loralee Pacas, MD   Problem Focused Charting:   Medical Decision Making per Assessment/Plan   Dia was seen today for possible conjuctivitis.  Periorificial dermatitis -     Nystatin-Triamcinolone; Apply 1 Application topically 2 (two) times daily. Max use 15 days see dermatology if not improving  Dispense: 30 g; Refill: 0  In my medical opinion this is most likely to be psoriatic perioral facial rash with secondary possibility of a tinea rash or a contact dermatitis near the eye.  I believe that nystatin triamcinolone apply just to the area for a couple weeks should resolve it but he will need to see dermatology if it fails to help.  #Dermatitis with Erythematous Rash  The 67 year old patient presents with complaints of itchiness and skin changes near the eye and next to the nose, which are described as darker erythematous slightly raised rashes. Given the patient's history of psoriasis, it is possible that the current skin changes are related to an exacerbation or a new presentation of a dermatological condition. The location and description of the rash could suggest several dermatological conditions, including but not limited to psoriasis, seborrheic dermatitis, or contact dermatitis. The differential diagnosis includes psoriasis flare, seborrheic dermatitis, contact dermatitis, atopic dermatitis, and cutaneous lupus erythematosus.  Dx - Comprehensive skin examination to assess the extent and characteristics of the rash - Skin biopsy for histopathological examination if the diagnosis is unclear - Patch testing if contact dermatitis is suspected - Complete blood count (CBC) to check for signs of infection or systemic inflammation - Antinuclear antibody (ANA) test  if cutaneous lupus erythematosus is considered  Tx - Topical corticosteroids for anti-inflammatory effect - Moisturizers to maintain skin hydration and barrier function - Topical calcineurin inhibitors if there is a poor response to steroids or if steroids are contraindicated - Phototherapy if the rash is consistent with psoriasis and extensive - Avoidance of known irritants or allergens if contact dermatitis is diagnosed - Education on skin care and avoidance of triggers that may exacerbate the condition   Subjective - Clinical Presentation:   John Bowen is a 67 y.o. male  Patient Active Problem List   Diagnosis Date Noted   Failed total knee arthroplasty ([place]) 07/31/2018   Dyslipidemia 07/17/2018   OSA (obstructive sleep apnea) 09/06/2016   Peyronie disease 06/13/2016   Osteoarthritis of left knee 04/20/2014   Essential hypertension 05/25/2008   Insomnia 04/02/2007   Past Medical History:  Diagnosis Date   Asthma    as a child   Complication of anesthesia    DJD (degenerative joint disease)    knees   Dysfunction of sleep stage or arousal    Hypertension    Meningeal disorder    Meningitis    Zoster   PONV (postoperative nausea and vomiting)    vomiting when got home after arthroscopic  knee surgery   Shingles    Sleep apnea    does not tolerate cpap    Outpatient Medications Prior to Visit  Medication Sig   lisinopril (ZESTRIL) 40 MG tablet Take 1 tablet by mouth once daily   meloxicam (MOBIC) 15 MG tablet Take 1 tablet by mouth daily.   PFIZER-BIONTECH COVID-19  VACC 30 MCG/0.3ML injection    traZODone (DESYREL) 100 MG tablet TAKE 1 & 1/2 (ONE & ONE-HALF) TABLETS BY MOUTH AT BEDTIME   Vitamin D, Ergocalciferol, (DRISDOL) 1.25 MG (50000 UNIT) CAPS capsule Take 1 capsule (50,000 Units total) by mouth every 7 (seven) days.   aspirin 81 MG EC tablet aspirin 81 mg tablet,delayed release  1 tablet by mouth once a day for three weeks following surgery (Patient not  taking: Reported on 06/29/2022)   hydrocortisone 2.5 % cream Apply 1 application topically daily as needed (itching). (Patient not taking: Reported on 06/29/2022)   No facility-administered medications prior to visit.    Chief Complaint  Patient presents with   Possible conjuctivitis    Woke up this morning with right eye swollen and slightly itchy.    HPI  Patients chart review and interview were used to generate a prompt for artificial intelligence analysis ([employer] artificial intelligence) clinical decision support.  AI output was reviewed and is provided in red:   Comprehensive Review of the Case: The individual is a 67 year old with a history of psoriasis who is experiencing itchiness and skin changes near the eye and adjacent to the nose. The skin changes are described as a dermatitis-like rash that is darker, erythematous, and slightly raised, located on the medial aspect of the upper and lower eyelid and next to the nose. There is no information provided regarding the patient's medications, allergies, social history, physical examination findings beyond the skin changes, laboratory data, illness course, or any imaging data and other studies. Without these details, it is challenging to form a complete clinical picture; however, the information given will be used to generate a differential diagnosis. The presence of additional symptoms, a more detailed history, or specific laboratory and imaging findings could further suggest a more precise diagnosis.  Most Likely Dx:  The most likely diagnosis for this individual is psoriasis with possible development of psoriatic plaques in the periorbital and perinasal areas. Psoriasis is a chronic inflammatory skin condition that can present with erythematous, raised plaques that may be pruritic. The patient's history of psoriasis makes it plausible that the current skin changes are a manifestation of this underlying condition, possibly exacerbated by  local factors or systemic triggers. The presence of silvery scales on the plaques or a positive Auspitz sign could further suggest this diagnosis.  Expanded DDx:  1. Seborrheic Dermatitis: Seborrheic dermatitis could explain the erythematous, slightly raised rash near the eye and nose, as these are common areas affected by this condition. It is characterized by red, scaly, itchy skin, and can sometimes be confused with psoriasis. The patient's age and the distribution of the rash are consistent with seborrheic dermatitis. The presence of greasy scales and improvement with antifungal or anti-inflammatory treatments could further suggest this diagnosis.  2. Atopic Dermatitis: Atopic dermatitis, also known as eczema, could account for the dermatitis-like rash and itchiness. This condition often presents with chronic, pruritic, erythematous plaques that can become lichenified or thickened over time. The periorbital and perinasal areas can be involved, especially in adults. The presence of personal or family history of atopy, such as asthma or allergic rhinitis, could further suggest this diagnosis.  Alternative DDx:  1. Contact Dermatitis: Contact dermatitis, either irritant or allergic, could explain the localized skin changes and itchiness. This condition occurs when the skin comes into contact with an irritating substance or an allergen, leading to an inflammatory reaction. The presence of a known allergen or irritant exposure and resolution of symptoms upon  avoidance could further suggest this diagnosis.  2. Cutaneous Lupus Erythematosus: Cutaneous lupus erythematosus could present with erythematous, slightly raised rashes in sun-exposed areas, such as around the eyes and nose. Given the patient's age, this autoimmune condition could be a consideration. The presence of antinuclear antibodies (ANA) or other specific serological markers could further suggest this diagnosis.  3. Rosacea: Rosacea is a  chronic skin condition that can cause erythema and papules, particularly in the central face, which could be mistaken for dermatitis. It is more common in fair-skinned individuals and can be exacerbated by various triggers. The presence of flushing, telangiectasia, or ocular symptoms could further suggest this diagnosis.  4. Periorificial Dermatitis: Periorificial dermatitis is characterized by erythematous papules around the orifices, such as the eyes and nose. It can be triggered by topical steroid use or other factors. The presence of a history of topical steroid use and improvement with discontinuation of the offending agent could further suggest this diagnosis.  5. Tinea Faciei: Tinea faciei is a fungal infection of the face that can present with erythematous, scaly patches that may be pruritic. The involvement of the periorbital and perinasal areas could fit this diagnosis. The presence of a positive fungal culture or improvement with antifungal treatment could further suggest this diagnosis.  6. Basal Cell Carcinoma: Although less likely given the description, basal cell carcinoma (BCC) can present as a raised, erythematous lesion, sometimes with itching. It is the most common type of skin cancer and often occurs on sun-exposed areas. The presence of a pearly border or central ulceration could further suggest this diagnosis.  7. Actinic Keratosis: Actinic keratosis is a precancerous lesion that can appear as a scaly, erythematous patch on sun-exposed skin. It is more common in older individuals with a history of significant sun exposure. The presence of rough, sandpaper-like texture to the lesions could further suggest this diagnosis.  8. Dermatomyositis: Dermatomyositis is an inflammatory myopathy that can have cutaneous manifestations, including a heliotrope rash around the eyes and Gottron's papules. The presence of muscle weakness or elevated muscle enzymes could further suggest this  diagnosis.  9. Squamous Cell Carcinoma in situ (Bowen's Disease): This form of squamous cell carcinoma presents as a persistent, erythematous, scaly patch that can be mistaken for dermatitis. The presence of a biopsy showing atypical squamous cells confined to the epidermis could further suggest this diagnosis.        Objective:  Physical Exam  BP (!) 138/90 (BP Location: Right Arm, Patient Position: Sitting)   Pulse 60   Temp 98.4 F (36.9 C) (Temporal)   Ht '5\' 10"'$  (1.778 m)   Wt 188 lb (85.3 kg)   SpO2 96%   BMI 26.98 kg/m     Overweight  by BMI criteria but truncal adiposity (waist circumference or caliper) should be used instead. Wt Readings from Last 10 Encounters:  06/29/22 188 lb (85.3 kg)  08/29/21 180 lb 3.2 oz (81.7 kg)  08/01/21 178 lb 8 oz (81 kg)  08/23/20 181 lb (82.1 kg)  08/23/20 181 lb 12.8 oz (82.5 kg)  09/03/19 183 lb (83 kg)  08/27/19 183 lb (83 kg)  07/31/18 191 lb (86.6 kg)  07/24/18 191 lb (86.6 kg)  07/15/18 192 lb (87.1 kg)   Vital signs reviewed.  Nursing notes reviewed. Weight trend reviewed. General Appearance:  Well developed, well nourished male in no acute distress.   Normal work of breathing at rest Musculoskeletal: All extremities are intact.  Neurological:  Awake, alert,  No obvious focal  neurological deficits or cognitive impairments Psychiatric:  Appropriate mood, pleasant demeanor Problem-specific findings:  eye rash red and raised around the skin near the eye as shown:           Signed: Loralee Pacas, MD 06/29/2022 2:09 PM

## 2022-07-11 ENCOUNTER — Encounter: Payer: Medicare HMO | Admitting: Family Medicine

## 2022-07-13 DIAGNOSIS — L821 Other seborrheic keratosis: Secondary | ICD-10-CM | POA: Diagnosis not present

## 2022-07-13 DIAGNOSIS — L218 Other seborrheic dermatitis: Secondary | ICD-10-CM | POA: Diagnosis not present

## 2022-07-14 ENCOUNTER — Encounter: Payer: Self-pay | Admitting: Family Medicine

## 2022-07-14 ENCOUNTER — Ambulatory Visit (INDEPENDENT_AMBULATORY_CARE_PROVIDER_SITE_OTHER): Payer: Medicare HMO | Admitting: Family Medicine

## 2022-07-14 VITALS — BP 138/73 | HR 58 | Temp 98.0°F | Ht 70.0 in | Wt 185.0 lb

## 2022-07-14 DIAGNOSIS — I10 Essential (primary) hypertension: Secondary | ICD-10-CM

## 2022-07-14 DIAGNOSIS — Z23 Encounter for immunization: Secondary | ICD-10-CM

## 2022-07-14 DIAGNOSIS — Z1159 Encounter for screening for other viral diseases: Secondary | ICD-10-CM | POA: Diagnosis not present

## 2022-07-14 DIAGNOSIS — E785 Hyperlipidemia, unspecified: Secondary | ICD-10-CM | POA: Diagnosis not present

## 2022-07-14 DIAGNOSIS — Z125 Encounter for screening for malignant neoplasm of prostate: Secondary | ICD-10-CM | POA: Diagnosis not present

## 2022-07-14 DIAGNOSIS — G4700 Insomnia, unspecified: Secondary | ICD-10-CM | POA: Diagnosis not present

## 2022-07-14 DIAGNOSIS — E538 Deficiency of other specified B group vitamins: Secondary | ICD-10-CM | POA: Diagnosis not present

## 2022-07-14 DIAGNOSIS — M1732 Unilateral post-traumatic osteoarthritis, left knee: Secondary | ICD-10-CM

## 2022-07-14 DIAGNOSIS — E559 Vitamin D deficiency, unspecified: Secondary | ICD-10-CM

## 2022-07-14 DIAGNOSIS — Z0001 Encounter for general adult medical examination with abnormal findings: Secondary | ICD-10-CM | POA: Diagnosis not present

## 2022-07-14 LAB — COMPREHENSIVE METABOLIC PANEL
ALT: 23 U/L (ref 0–53)
AST: 18 U/L (ref 0–37)
Albumin: 4.5 g/dL (ref 3.5–5.2)
Alkaline Phosphatase: 78 U/L (ref 39–117)
BUN: 16 mg/dL (ref 6–23)
CO2: 31 mEq/L (ref 19–32)
Calcium: 9.5 mg/dL (ref 8.4–10.5)
Chloride: 105 mEq/L (ref 96–112)
Creatinine, Ser: 1.01 mg/dL (ref 0.40–1.50)
GFR: 76.13 mL/min (ref >60.00)
Glucose, Bld: 91 mg/dL (ref 70–99)
Potassium: 4.3 mEq/L (ref 3.5–5.1)
Sodium: 145 mEq/L (ref 135–145)
Total Bilirubin: 0.6 mg/dL (ref 0.2–1.2)
Total Protein: 6.7 g/dL (ref 6.0–8.3)

## 2022-07-14 LAB — CBC
HCT: 45.4 % (ref 39.0–52.0)
Hemoglobin: 15.2 g/dL (ref 13.0–17.0)
MCHC: 33.4 g/dL (ref 30.0–36.0)
MCV: 91.9 fl (ref 78.0–100.0)
Platelets: 271 10*3/uL (ref 150.0–400.0)
RBC: 4.94 Mil/uL (ref 4.22–5.81)
RDW: 13.5 % (ref 11.5–15.5)
WBC: 3.8 10*3/uL — ABNORMAL LOW (ref 4.0–10.5)

## 2022-07-14 LAB — LIPID PANEL
Cholesterol: 188 mg/dL (ref 0–200)
HDL: 64.2 mg/dL (ref >39.00)
LDL Cholesterol: 111 mg/dL — ABNORMAL HIGH (ref 0–99)
NonHDL: 123.35
Total CHOL/HDL Ratio: 3
Triglycerides: 60 mg/dL (ref 0.0–149.0)
VLDL: 12 mg/dL (ref 0.0–40.0)

## 2022-07-14 LAB — VITAMIN D 25 HYDROXY (VIT D DEFICIENCY, FRACTURES): VITD: 25.55 ng/mL — ABNORMAL LOW (ref 30.00–100.00)

## 2022-07-14 LAB — VITAMIN B12: Vitamin B-12: 208 pg/mL — ABNORMAL LOW (ref 211–911)

## 2022-07-14 LAB — PSA: PSA: 1.48 ng/mL (ref 0.10–4.00)

## 2022-07-14 LAB — TSH: TSH: 1.41 u[IU]/mL (ref 0.35–5.50)

## 2022-07-14 MED ORDER — GABAPENTIN 300 MG PO CAPS: 300.0000 mg | ORAL_CAPSULE | Freq: Every day | ORAL | 3 refills | Status: DC

## 2022-07-14 NOTE — Assessment & Plan Note (Signed)
Symptoms are not controlled.  He is not sure if trazodone has been taken effective.  He has tried several other medications in the past including hydroxyzine and Ambien which did not work well.  He was unable to afford Belsomra due to lack of insurance coverage.  We discussed alternative treatment options.  Will start gabapentin 300 mg nightly.  Hopefully this will help some of his chronic knee pain as well.  He can send a message in a few weeks to limit how this is working for him.  Depending on response with consider trial of doxepin or amitriptyline if he does not do well with gabapentin.

## 2022-07-14 NOTE — Assessment & Plan Note (Signed)
Check vitamin D. 

## 2022-07-14 NOTE — Progress Notes (Signed)
Chief Complaint:  John Bowen is a 67 y.o. male who presents today for his annual comprehensive physical exam.    Assessment/Plan:  Chronic Problems Addressed Today: Insomnia Symptoms are not controlled.  He is not sure if trazodone has been taken effective.  He has tried several other medications in the past including hydroxyzine and Ambien which did not work well.  He was unable to afford Belsomra due to lack of insurance coverage.  We discussed alternative treatment options.  Will start gabapentin 300 mg nightly.  Hopefully this will help some of his chronic knee pain as well.  He can send a message in a few weeks to limit how this is working for him.  Depending on response with consider trial of doxepin or amitriptyline if he does not do well with gabapentin.  Essential hypertension Blood pressure at goal today on lisinopril 40 mg daily.  Will check labs.  Osteoarthritis of left knee Continue management per Orthopedics.  Still has quite a bit of pain in his knee.  Currently managing the best he can.  Dyslipidemia Check lipids.  Has not tolerated statins in the past.  Avitaminosis D Check vitamin D.  B12 deficiency Check B12.  Preventative Healthcare: Check labs today.  Flu shot given today.  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  Advised him to go to the pharmacy to have this done.  He will look into this.  He is also due for pneumonia vaccine however deferred for today.  We can address at next office visit.  He is up-to-date on colon cancer screening.  Patient Counseling(The following topics were reviewed and/or handout was given):  -Nutrition: Stressed importance of moderation in sodium/caffeine intake, saturated fat and cholesterol, caloric balance, sufficient intake of fresh fruits, vegetables, and fiber.  -Stressed the importance of regular exercise.   -Substance Abuse: Discussed cessation/primary prevention of tobacco, alcohol, or  other drug use; driving or other dangerous activities under the influence; availability of treatment for abuse.   -Injury prevention: Discussed safety belts, safety helmets, smoke detector, smoking near bedding or upholstery.   -Sexuality: Discussed sexually transmitted diseases, partner selection, use of condoms, avoidance of unintended pregnancy and contraceptive alternatives.   -Dental health: Discussed importance of regular tooth brushing, flossing, and dental visits.  -Health maintenance and immunizations reviewed. Please refer to Health maintenance section.  Return to care in 1 year for next preventative visit.     Subjective:  HPI:  He has no acute complaints today. See A/p for status of chronic conditions.   Lifestyle Diet: Balanced. Plenty of fruits and vegetables.  Exercise: Plays golf routinely.      07/14/2022    8:11 AM  Depression screen PHQ 2/9  Decreased Interest 0  Down, Depressed, Hopeless 0  PHQ - 2 Score 0    Health Maintenance Due  Topic Date Due   Hepatitis C Screening  Never done   Medicare Annual Wellness (AWV)  08/30/2022     ROS: Per HPI, otherwise a complete review of systems was negative.   PMH:  The following were reviewed and entered/updated in epic: Past Medical History:  Diagnosis Date   Asthma    as a child   Complication of anesthesia    DJD (degenerative joint disease)    knees   Dysfunction of sleep stage or arousal    Hypertension    Meningeal disorder    Meningitis    Zoster   PONV (postoperative nausea and vomiting)  vomiting when got home after arthroscopic  knee surgery   Shingles    Sleep apnea    does not tolerate cpap   Patient Active Problem List   Diagnosis Date Noted   Avitaminosis D 07/14/2022   B12 deficiency 07/14/2022   Dyslipidemia 07/17/2018   OSA (obstructive sleep apnea) 09/06/2016   Peyronie disease 06/13/2016   Osteoarthritis of left knee 04/20/2014   Essential hypertension 05/25/2008   Insomnia  04/02/2007   Past Surgical History:  Procedure Laterality Date   arthroscopic knee     right   FRACTURE SURGERY       Left wrist and right hand   JOINT REPLACEMENT     left knee 1979 , left knee 2018   KNEE ARTHROSCOPY Left 07/26/2021   scar tissue from prev surgeries   TOTAL KNEE REVISION Left 07/31/2018   Procedure: Left knee polyethylene revision;  Surgeon: Gaynelle Arabian, MD;  Location: [facility];  Service: Orthopedics;  Laterality: Left;  82mn    Family History  Problem Relation Age of Onset   Hypertension Other     Medications- reviewed and updated Current Outpatient Medications  Medication Sig Dispense Refill   aspirin 81 MG EC tablet      gabapentin (NEURONTIN) 300 MG capsule Take 1 capsule (300 mg total) by mouth at bedtime. 90 capsule 3   hydrocortisone 2.5 % cream Apply 1 application  topically daily as needed (itching).     lisinopril (ZESTRIL) 40 MG tablet Take 1 tablet by mouth once daily 90 tablet 0   PFIZER-BIONTECH COVID-19 VACC 30 MCG/0.3ML injection      traZODone (DESYREL) 100 MG tablet TAKE 1 & 1/2 (ONE & ONE-HALF) TABLETS BY MOUTH AT BEDTIME 60 tablet 0   Vitamin D, Ergocalciferol, (DRISDOL) 1.25 MG (50000 UNIT) CAPS capsule Take 1 capsule (50,000 Units total) by mouth every 7 (seven) days. 12 capsule 0   No current facility-administered medications for this visit.    Allergies-reviewed and updated Allergies  Allergen Reactions   Penicillins     Social History   Socioeconomic History   Marital status: Married    Spouse name: Not on file   Number of children: 1   Years of education: Not on file   Highest education level: Not on file  Occupational History   Occupation: Retired   Tobacco Use   Smoking status: Never   Smokeless tobacco: Never  Vaping Use   Vaping Use: Never used  Substance and Sexual Activity   Alcohol use: Yes    Alcohol/week: 3.0 standard drinks of alcohol    Types: 3 Cans of beer per week    Comment: a week   Drug use:  No   Sexual activity: Yes  Other Topics Concern   Not on file  Social History Narrative   1 Son    Lives with spouse    Social Determinants of Health   Financial Resource Strain: Low Risk  (08/29/2021)   Overall Financial Resource Strain (CARDIA)    Difficulty of Paying Living Expenses: Not hard at all  Food Insecurity: No Food Insecurity (08/29/2021)   Hunger Vital Sign    Worried About Running Out of Food in the Last Year: Never true    Ran Out of Food in the Last Year: Never true  Transportation Needs: No Transportation Needs (08/29/2021)   PRAPARE - THydrologist(Medical): No    Lack of Transportation (Non-Medical): No  Physical Activity: Insufficiently Active (08/29/2021)  Exercise Vital Sign    Days of Exercise per Week: 3 days    Minutes of Exercise per Session: 20 min  Stress: No Stress Concern Present (08/29/2021)   [place]    Feeling of Stress : Not at all  Social Connections: [place] (08/29/2021)   Social Connection and Isolation Panel [NHANES]    Frequency of Communication with Friends and Family: Twice a week    Frequency of Social Gatherings with Friends and Family: More than three times a week    Attends Religious Services: 1 to 4 times per year    Active Member of [employer] or Organizations: Yes    Attends Archivist Meetings: 1 to 4 times per year    Marital Status: Married        Objective:  Physical Exam: BP 138/73   Pulse (!) 58   Temp 98 F (36.7 C) (Temporal)   Ht 5' 10"$  (1.778 m)   Wt 185 lb (83.9 kg)   SpO2 97%   BMI 26.54 kg/m   Body mass index is 26.54 kg/m. Wt Readings from Last 3 Encounters:  07/14/22 185 lb (83.9 kg)  06/29/22 188 lb (85.3 kg)  08/29/21 180 lb 3.2 oz (81.7 kg)   Gen: NAD, resting comfortably HEENT: TMs normal bilaterally. OP clear. No thyromegaly noted.  CV: RRR with no murmurs appreciated Pulm: NWOB, CTAB  with no crackles, wheezes, or rhonchi GI: Normal bowel sounds present. Soft, Nontender, Nondistended. MSK: no edema, cyanosis, or clubbing noted Skin: warm, dry Neuro: CN2-12 grossly intact. Strength 5/5 in upper and lower extremities. Reflexes symmetric and intact bilaterally.  Psych: Normal affect and thought content     Kingdom Vanzanten M. Jerline Pain, MD 07/14/2022 8:53 AM

## 2022-07-14 NOTE — Assessment & Plan Note (Signed)
Check B12 

## 2022-07-14 NOTE — Assessment & Plan Note (Signed)
Check lipids.  Has not tolerated statins in the past.

## 2022-07-14 NOTE — Assessment & Plan Note (Signed)
Blood pressure at goal today on lisinopril 40 mg daily.  Will check labs.

## 2022-07-14 NOTE — Assessment & Plan Note (Signed)
Continue management per Orthopedics.  Still has quite a bit of pain in his knee.  Currently managing the best he can.

## 2022-07-14 NOTE — Patient Instructions (Addendum)
It was very nice to see you today!  We will check blood work today.  We will start gabapentin for your sleep.  Please send a message in a few weeks limit how this is working.  Please continue work on diet and exercise.  Please check about the shingles vaccine at your pharmacy.  Please let us know when you like your pneumonia vaccine.  I will see back in year for your next physical.  Please come back sooner if needed.  Take care, Dr Jerline Pain  PLEASE NOTE:  If you had any lab tests, please let us know if you have not heard back within a few days. You may see your results on mychart before we have a chance to review them but we will give you a call once they are reviewed by [place].   If we ordered any referrals today, please let us know if you have not heard from their office within the next week.   If you had any urgent prescriptions sent in today, please check with the pharmacy within an hour of our visit to make sure the prescription was transmitted appropriately.   Please try these tips to maintain a healthy lifestyle:  Eat at least 3 REAL meals and 1-2 snacks per day.  Aim for no more than 5 hours between eating.  If you eat breakfast, please do so within one hour of getting up.   Each meal should contain half fruits/vegetables, one quarter protein, and one quarter carbs (no bigger than a computer mouse)  Cut down on sweet beverages. This includes juice, soda, and sweet tea.   Drink at least 1 glass of water with each meal and aim for at least 8 glasses per day  Exercise at least 150 minutes every week.    Preventive Care 73 Years and Older, Male Preventive care refers to lifestyle choices and visits with your health care provider that can promote health and wellness. Preventive care visits are also called wellness exams. What can I expect for my preventive care visit? Counseling During your preventive care visit, your health care provider may ask about your: Medical history,  including: Past medical problems. Family medical history. History of falls. Current health, including: Emotional well-being. Home life and relationship well-being. Sexual activity. Memory and ability to understand (cognition). Lifestyle, including: Alcohol, nicotine or tobacco, and drug use. Access to firearms. Diet, exercise, and sleep habits. Work and work Statistician. Sunscreen use. Safety issues such as seatbelt and bike helmet use. Physical exam Your health care provider will check your: Height and weight. These may be used to calculate your BMI (body mass index). BMI is a measurement that tells if you are at a healthy weight. Waist circumference. This measures the distance around your waistline. This measurement also tells if you are at a healthy weight and may help predict your risk of certain diseases, such as type 2 diabetes and high blood pressure. Heart rate and blood pressure. Body temperature. Skin for abnormal spots. What immunizations do I need?  Vaccines are usually given at various ages, according to a schedule. Your health care provider will recommend vaccines for you based on your age, medical history, and lifestyle or other factors, such as travel or where you work. What tests do I need? Screening Your health care provider may recommend screening tests for certain conditions. This may include: Lipid and cholesterol levels. Diabetes screening. This is done by checking your blood sugar (glucose) after you have not eaten for a while (  fasting). Hepatitis C test. Hepatitis B test. HIV (human immunodeficiency virus) test. STI (sexually transmitted infection) testing, if you are at risk. Lung cancer screening. Colorectal cancer screening. Prostate cancer screening. Abdominal aortic aneurysm (AAA) screening. You may need this if you are a current or former smoker. Talk with your health care provider about your test results, treatment options, and if necessary, the  need for more tests. Follow these instructions at home: Eating and drinking  Eat a diet that includes fresh fruits and vegetables, whole grains, lean protein, and low-fat dairy products. Limit your intake of foods with high amounts of sugar, saturated fats, and salt. Take vitamin and mineral supplements as recommended by your health care provider. Do not drink alcohol if your health care provider tells you not to drink. If you drink alcohol: Limit how much you have to 0-2 drinks a day. Know how much alcohol is in your drink. In the [place] one drink equals one 12 oz bottle of beer (355 mL), one 5 oz glass of wine (148 mL), or one 1 oz glass of hard liquor (44 mL). Lifestyle Brush your teeth every morning and night with fluoride toothpaste. Floss one time each day. Exercise for at least 30 minutes 5 or more days each week. Do not use any products that contain nicotine or tobacco. These products include cigarettes, chewing tobacco, and vaping devices, such as e-cigarettes. If you need help quitting, ask your health care provider. Do not use drugs. If you are sexually active, practice safe sex. Use a condom or other form of protection to prevent STIs. Take aspirin only as told by your health care provider. Make sure that you understand how much to take and what form to take. Work with your health care provider to find out whether it is safe and beneficial for you to take aspirin daily. Ask your health care provider if you need to take a cholesterol-lowering medicine (statin). Find healthy ways to manage stress, such as: Meditation, yoga, or listening to music. Journaling. Talking to a trusted person. Spending time with friends and family. Safety Always wear your seat belt while driving or riding in a vehicle. Do not drive: If you have been drinking alcohol. Do not ride with someone who has been drinking. When you are tired or distracted. While texting. If you have been using any  mind-altering substances or drugs. Wear a helmet and other protective equipment during sports activities. If you have firearms in your house, make sure you follow all gun safety procedures. Minimize exposure to UV radiation to reduce your risk of skin cancer. What's next? Visit your health care provider once a year for an annual wellness visit. Ask your health care provider how often you should have your eyes and teeth checked. Stay up to date on all vaccines. This information is not intended to replace advice given to you by your health care provider. Make sure you discuss any questions you have with your health care provider. Document Revised: 11/10/2020 Document Reviewed: 11/10/2020 Elsevier Patient Education  [place].

## 2022-07-17 LAB — HEPATITIS C ANTIBODY: Hepatitis C Ab: NONREACTIVE

## 2022-07-18 NOTE — Progress Notes (Signed)
Please inform patient of the following:  Vitamin D and vitamin B12 both borderline low.  Recommend starting vitamin D 1000 IUs daily and B12 1000 mcg daily.  We can recheck this in 3 to 6 months.  His LDL is slightly elevated.  Would be reasonable to start cholesterol medication to improve this Low risk heart attack and stroke.  Please send in Lipitor 40 mg daily if he is agreeable to start.  Regardless, he should continue to work on diet and exercise and we can recheck in year.    Everything else is stable and we can recheck in year.

## 2022-07-19 ENCOUNTER — Other Ambulatory Visit: Payer: Self-pay

## 2022-07-19 DIAGNOSIS — E559 Vitamin D deficiency, unspecified: Secondary | ICD-10-CM

## 2022-07-19 DIAGNOSIS — E538 Deficiency of other specified B group vitamins: Secondary | ICD-10-CM

## 2022-07-19 MED ORDER — ATORVASTATIN CALCIUM 40 MG PO TABS: 40.0000 mg | ORAL_TABLET | Freq: Every day | ORAL | 3 refills | Status: DC

## 2022-07-24 DIAGNOSIS — R69 Illness, unspecified: Secondary | ICD-10-CM | POA: Diagnosis not present

## 2022-07-25 DIAGNOSIS — D2372 Other benign neoplasm of skin of left lower limb, including hip: Secondary | ICD-10-CM | POA: Diagnosis not present

## 2022-07-25 DIAGNOSIS — D2371 Other benign neoplasm of skin of right lower limb, including hip: Secondary | ICD-10-CM | POA: Diagnosis not present

## 2022-07-25 DIAGNOSIS — L218 Other seborrheic dermatitis: Secondary | ICD-10-CM | POA: Diagnosis not present

## 2022-07-25 DIAGNOSIS — L821 Other seborrheic keratosis: Secondary | ICD-10-CM | POA: Diagnosis not present

## 2022-07-25 DIAGNOSIS — D2271 Melanocytic nevi of right lower limb, including hip: Secondary | ICD-10-CM | POA: Diagnosis not present

## 2022-07-25 DIAGNOSIS — D225 Melanocytic nevi of trunk: Secondary | ICD-10-CM | POA: Diagnosis not present

## 2022-07-25 DIAGNOSIS — D1801 Hemangioma of skin and subcutaneous tissue: Secondary | ICD-10-CM | POA: Diagnosis not present

## 2022-07-25 DIAGNOSIS — D2261 Melanocytic nevi of right upper limb, including shoulder: Secondary | ICD-10-CM | POA: Diagnosis not present

## 2022-07-25 DIAGNOSIS — D2272 Melanocytic nevi of left lower limb, including hip: Secondary | ICD-10-CM | POA: Diagnosis not present

## 2022-07-25 DIAGNOSIS — L812 Freckles: Secondary | ICD-10-CM | POA: Diagnosis not present

## 2022-07-27 ENCOUNTER — Other Ambulatory Visit: Payer: Self-pay | Admitting: Family Medicine

## 2022-08-21 DIAGNOSIS — H5210 Myopia, unspecified eye: Secondary | ICD-10-CM | POA: Diagnosis not present

## 2022-08-24 ENCOUNTER — Encounter: Payer: Self-pay | Admitting: Family

## 2022-08-24 ENCOUNTER — Ambulatory Visit (INDEPENDENT_AMBULATORY_CARE_PROVIDER_SITE_OTHER): Payer: Medicare HMO | Admitting: Family

## 2022-08-24 ENCOUNTER — Other Ambulatory Visit: Payer: Self-pay | Admitting: Family Medicine

## 2022-08-24 VITALS — BP 161/84 | HR 63 | Temp 98.0°F | Ht 70.0 in | Wt 184.4 lb

## 2022-08-24 DIAGNOSIS — H00014 Hordeolum externum left upper eyelid: Secondary | ICD-10-CM

## 2022-08-24 DIAGNOSIS — I10 Essential (primary) hypertension: Secondary | ICD-10-CM

## 2022-08-24 MED ORDER — ERYTHROMYCIN 5 MG/GM OP OINT: 1.0000 | TOPICAL_OINTMENT | Freq: Three times a day (TID) | OPHTHALMIC | 0 refills | Status: AC

## 2022-08-24 NOTE — Progress Notes (Signed)
Patient ID: John Bowen, male    DOB: 01/16/1956, 67 y.o.   MRN: [id number]  Chief Complaint  Patient presents with   Stye    Pt c/o stye on left eye. Present for about a week, Has tried hydrocortisone which did not help. Currently taking keflex prescribed from his ophthalmologist, Started Tuesday.     HPI:      Eye irritation:  pt has an enlarged bump on his left upper eyelid. He has been seen by his optometrist who gave him Keflex which he started on Tuesday, he states it has not changed. He denies pain, but it is uncomfortable, and describes a burning and itching all over top of eyelid which he has been applying hydrocortisone cream. Denies pain on inside of eye. Reports having a similar problem on his other eye not long ago that his PCP treated and it resolved.    Assessment & Plan:  1. Hordeolum externum of left upper eyelid - Advised to continue the Keflex he is already taking, continue applying warm, moist compresses tid, sending abt ointment, can apply now if desired but mainly needed if stye opens and drains, if this happens, advised to clean the eyelid well with soap and water, dry, and then apply ointment to inside of upper eyelid and on the opened stye as directed on RX. Also advised to schedule an appt with ophthalmology just in case needed.  - erythromycin ophthalmic ointment; Place 1 Application into the left eye 3 (three) times daily for 7 days. Apply to the inside of your upper eyelid.  Dispense: 21 g; Refill: 0  2. Elevated blood pressure reading in office with diagnosis of hypertension - pt reports BP normally runs around 140s at the highest at home. Denies any sx. Takes Lisinopril 40mg  qhs. Advised pt drink about 20oz of water, no more salty foods today, recheck BP 1 hour after hydrating. If still elevated he can take an extra 1/2 pill of his Lisinopril. Continue to monitor BP at random times and if continues to be >140/90 he needs to notify PCP.  Subjective:     Outpatient Medications Prior to Visit  Medication Sig Dispense Refill   aspirin 81 MG EC tablet      atorvastatin (LIPITOR) 40 MG tablet Take 1 tablet (40 mg total) by mouth daily. 90 tablet 3   cephALEXin (KEFLEX) 500 MG capsule Take 500 mg by mouth 2 (two) times daily.     gabapentin (NEURONTIN) 300 MG capsule Take 1 capsule (300 mg total) by mouth at bedtime. 90 capsule 3   hydrocortisone 2.5 % cream Apply 1 application  topically daily as needed (itching).     lisinopril (ZESTRIL) 40 MG tablet Take 1 tablet by mouth once daily 90 tablet 0   PFIZER-BIONTECH COVID-19 VACC 30 MCG/0.3ML injection      traZODone (DESYREL) 100 MG tablet TAKE 1 & 1/2 (ONE & ONE-HALF) TABLETS BY MOUTH AT BEDTIME 60 tablet 0   Vitamin D, Ergocalciferol, (DRISDOL) 1.25 MG (50000 UNIT) CAPS capsule Take 1 capsule (50,000 Units total) by mouth every 7 (seven) days. 12 capsule 0   meloxicam (MOBIC) 15 MG tablet Take 15 mg by mouth daily.     No facility-administered medications prior to visit.   Past Medical History:  Diagnosis Date   Asthma    as a child   Complication of anesthesia    DJD (degenerative joint disease)    knees   Dysfunction of sleep stage or arousal  Hypertension    Meningeal disorder    Meningitis    Zoster   PONV (postoperative nausea and vomiting)    vomiting when got home after arthroscopic  knee surgery   Shingles    Sleep apnea    does not tolerate cpap   Past Surgical History:  Procedure Laterality Date   arthroscopic knee     right   FRACTURE SURGERY       Left wrist and right hand   JOINT REPLACEMENT     left knee 1979 , left knee 2018   KNEE ARTHROSCOPY Left 07/26/2021   scar tissue from prev surgeries   TOTAL KNEE REVISION Left 07/31/2018   Procedure: Left knee polyethylene revision;  Surgeon: Gaynelle Arabian, MD;  Location: [facility];  Service: Orthopedics;  Laterality: Left;  89min   Allergies  Allergen Reactions   Penicillins       Objective:    Physical  Exam Vitals and nursing note reviewed.  Constitutional:      General: He is not in acute distress.    Appearance: Normal appearance.  HENT:     Head: Normocephalic.  Eyes:     General:        Left eye: Hordeolum (approx. 1.5cm in diameter, raised above edge of upper eyelid, with yellowish appearing pus below the skin, mild erythema) present.    Extraocular Movements: Extraocular movements intact.     Conjunctiva/sclera:     Left eye: Left conjunctiva is not injected. No exudate or hemorrhage. Cardiovascular:     Rate and Rhythm: Normal rate and regular rhythm.  Pulmonary:     Effort: Pulmonary effort is normal.     Breath sounds: Normal breath sounds.  Musculoskeletal:        General: Normal range of motion.     Cervical back: Normal range of motion.  Skin:    General: Skin is warm and dry.  Neurological:     Mental Status: He is alert and oriented to person, place, and time.  Psychiatric:        Mood and Affect: Mood normal.    BP (!) 161/84 (BP Location: Left Arm, Patient Position: Sitting, Cuff Size: Large)   Pulse 63   Temp 98 F (36.7 C) (Temporal)   Ht 5\' 10"  (1.778 m)   Wt 184 lb 6 oz (83.6 kg)   SpO2 96%   BMI 26.46 kg/m  Wt Readings from Last 3 Encounters:  08/24/22 184 lb 6 oz (83.6 kg)  07/14/22 185 lb (83.9 kg)  06/29/22 188 lb (85.3 kg)       Jeanie Sewer, NP

## 2022-09-06 ENCOUNTER — Other Ambulatory Visit: Payer: Self-pay | Admitting: Family Medicine

## 2022-09-18 ENCOUNTER — Ambulatory Visit (INDEPENDENT_AMBULATORY_CARE_PROVIDER_SITE_OTHER): Payer: Medicare HMO

## 2022-09-18 VITALS — BP 160/82 | HR 71 | Temp 97.7°F | Wt 187.0 lb

## 2022-09-18 DIAGNOSIS — Z0000 Encounter for general adult medical examination without abnormal findings: Secondary | ICD-10-CM | POA: Diagnosis not present

## 2022-09-18 NOTE — Progress Notes (Signed)
Subjective:   John Bowen is a 67 y.o. male who presents for Medicare Annual/Subsequent preventive examination.  Review of Systems     Cardiac Risk Factors include: advanced age (>20men, >15 women);dyslipidemia;male gender;hypertension     Objective:    Today's Vitals   09/18/22 1502 09/18/22 1516  BP: (!) 162/82 (!) 160/82  Pulse: 71   Temp: 97.7 F (36.5 C)   Weight: 187 lb (84.8 kg)    Body mass index is 26.83 kg/m.     09/18/2022    3:12 PM 08/29/2021    8:13 AM 08/08/2019    3:33 PM 07/31/2018   10:55 AM 07/24/2018    9:31 AM  Advanced Directives  Does Patient Have a Medical Advance Directive? Yes Yes No No No  Type of Estate agent of [place];Living will Healthcare Power of Attorney     Copy of Healthcare Power of Attorney in Chart? No - copy requested No - copy requested     Would patient like information on creating a medical advance directive?   Yes (MAU/Ambulatory/Procedural Areas - Information given) No - Patient declined No - Patient declined    Current Medications (verified) Outpatient Encounter Medications as of 09/18/2022  Medication Sig   aspirin 81 MG EC tablet    hydrocortisone 2.5 % cream Apply 1 application  topically daily as needed (itching).   lisinopril (ZESTRIL) 40 MG tablet Take 1 tablet by mouth once daily   meloxicam (MOBIC) 15 MG tablet Take 15 mg by mouth daily.   traZODone (DESYREL) 100 MG tablet TAKE 1 & 1/2 (ONE & ONE-HALF) TABLETS BY MOUTH AT BEDTIME   Vitamin D, Ergocalciferol, (DRISDOL) 1.25 MG (50000 UNIT) CAPS capsule Take 1 capsule (50,000 Units total) by mouth every 7 (seven) days.   atorvastatin (LIPITOR) 40 MG tablet Take 1 tablet (40 mg total) by mouth daily. (Patient not taking: Reported on 09/18/2022)   gabapentin (NEURONTIN) 300 MG capsule Take 1 capsule (300 mg total) by mouth at bedtime. (Patient not taking: Reported on 09/18/2022)   PFIZER-BIONTECH COVID-19 VACC 30 MCG/0.3ML injection    [DISCONTINUED]  cephALEXin (KEFLEX) 500 MG capsule Take 500 mg by mouth 2 (two) times daily.   No facility-administered encounter medications on file as of 09/18/2022.    Allergies (verified) Penicillins   History: Past Medical History:  Diagnosis Date   Asthma    as a child   Complication of anesthesia    DJD (degenerative joint disease)    knees   Dysfunction of sleep stage or arousal    Hypertension    Meningeal disorder    Meningitis    Zoster   PONV (postoperative nausea and vomiting)    vomiting when got home after arthroscopic  knee surgery   Shingles    Sleep apnea    does not tolerate cpap   Past Surgical History:  Procedure Laterality Date   arthroscopic knee     right   FRACTURE SURGERY       Left wrist and right hand   JOINT REPLACEMENT     left knee 1979 , left knee 2018   KNEE ARTHROSCOPY Left 07/26/2021   scar tissue from prev surgeries   TOTAL KNEE REVISION Left 07/31/2018   Procedure: Left knee polyethylene revision;  Surgeon: Ollen Gross, MD;  Location: [facility];  Service: Orthopedics;  Laterality: Left;    Family History  Problem Relation Age of Onset   Hypertension Other    Social History  Socioeconomic History   Marital status: Married    Spouse name: Not on file   Number of children: 1   Years of education: Not on file   Highest education level: Not on file  Occupational History   Occupation: Retired   Tobacco Use   Smoking status: Never   Smokeless tobacco: Never  Vaping Use   Vaping Use: Never used  Substance and Sexual Activity   Alcohol use: Yes    Alcohol/week: 3.0 standard drinks of alcohol    Types: 3 Cans of beer per week    Comment: a week   Drug use: No   Sexual activity: Yes  Other Topics Concern   Not on file  Social History Narrative   1 Son    Lives with spouse    Social Determinants of Health   Financial Resource Strain: Low Risk  (09/18/2022)   Overall Financial Resource Strain (CARDIA)    Difficulty of Paying  Living Expenses: Not hard at all  Food Insecurity: No Food Insecurity (09/18/2022)   Hunger Vital Sign    Worried About Running Out of Food in the Last Year: Never true    Ran Out of Food in the Last Year: Never true  Transportation Needs: No Transportation Needs (09/18/2022)   PRAPARE - Administrator, Civil Service (Medical): No    Lack of Transportation (Non-Medical): No  Physical Activity: Sufficiently Active (09/18/2022)   Exercise Vital Sign    Days of Exercise per Week: 3 days    Minutes of Exercise per Session: 60 min  Stress: No Stress Concern Present (09/18/2022)   [employer] of Occupational Health - Occupational Stress Questionnaire    Feeling of Stress : Not at all  Social Connections: Socially Integrated (09/18/2022)   Social Connection and Isolation Panel [NHANES]    Frequency of Communication with Friends and Family: Twice a week    Frequency of Social Gatherings with Friends and Family: More than three times a week    Attends Religious Services: 1 to 4 times per year    Active Member of [employer] or Organizations: Yes    Attends Banker Meetings: 1 to 4 times per year    Marital Status: Married    Tobacco Counseling Counseling given: Not Answered   Clinical Intake:  Pre-visit preparation completed: Yes  Pain : No/denies pain     BMI - recorded: 26.83 Nutritional Status: BMI 25 -29 Overweight Nutritional Risks: None Diabetes: No  How often do you need to have someone help you when you read instructions, pamphlets, or other written materials from your doctor or pharmacy?: 1 - Never  Diabetic?no  Interpreter Needed?: No  Information entered by :: Lanier Ensign, LPN   Activities of Daily Living    09/18/2022    3:13 PM  In your present state of health, do you have any difficulty performing the following activities:  Hearing? 0  Vision? 0  Difficulty concentrating or making decisions? 0  Walking or climbing stairs? 0   Dressing or bathing? 0  Doing errands, shopping? 0  Preparing Food and eating ? N  Using the Toilet? N  In the past six months, have you accidently leaked urine? N  Do you have problems with loss of bowel control? N  Managing your Medications? N  Managing your Finances? N  Housekeeping or managing your Housekeeping? N    Patient Care Team: Ardith Dark, MD as PCP - General (Family Medicine) Ollen Gross, MD  as Consulting Physician (Orthopedic Surgery)  Indicate any recent Medical Services you may have received from other than [facility] providers in the past year (date may be approximate).     Assessment:   This is a routine wellness examination for [place].  Hearing/Vision screen Hearing Screening - Comments:: Pt denies any hearing issues  Vision Screening - Comments:: Pt follows up with Dr Jimmye Norman for annual eye exams   Dietary issues and exercise activities discussed: Current Exercise Habits: Home exercise routine, Type of exercise: Other - see comments;walking, Time (Minutes): 60, Frequency (Times/Week): 3, Weekly Exercise (Minutes/Week): 180   Goals Addressed             This Visit's Progress    Patient Stated       Stay healthy       Depression Screen    09/18/2022    3:11 PM 07/14/2022    8:11 AM 08/29/2021    8:12 AM 08/23/2020    9:36 AM 08/23/2020    8:20 AM 08/08/2019    3:34 PM 07/15/2018    1:17 PM  PHQ 2/9 Scores  PHQ - 2 Score 0 0 0 0 0 0 0    Fall Risk    09/18/2022    3:13 PM 07/14/2022    8:11 AM 08/29/2021    8:14 AM 08/23/2020    9:39 AM 08/27/2019    8:16 AM  Fall Risk   Falls in the past year? 0 0 0 0 0  Number falls in past yr: 0 0 0 0   Injury with Fall? 0 0 0 0   Risk for fall due to : Impaired vision No Fall Risks Impaired vision Impaired vision   Follow up Falls prevention discussed  Falls prevention discussed Falls prevention discussed     FALL RISK PREVENTION PERTAINING TO THE HOME:  Any stairs in or around the home? Yes   If so, are there any without handrails? No  Home free of loose throw rugs in walkways, pet beds, electrical cords, etc? Yes  Adequate lighting in your home to reduce risk of falls? Yes   ASSISTIVE DEVICES UTILIZED TO PREVENT FALLS:  Life alert? No  Use of a cane, walker or w/c? No  Grab bars in the bathroom? No  Shower chair or bench in shower? No  Elevated toilet seat or a handicapped toilet? No   TIMED UP AND GO:  Was the test performed? Yes .  Length of time to ambulate 10 feet: 10 sec.   Gait steady and fast without use of assistive device  Cognitive Function:        09/18/2022    3:14 PM 08/29/2021    8:16 AM 08/23/2020    9:43 AM 08/08/2019    3:34 PM  6CIT Screen  What Year? 0 points 0 points 0 points 0 points  What month? 0 points 0 points 0 points 0 points  What time? 0 points 0 points  0 points  Count back from 20 0 points 0 points 0 points 0 points  Months in reverse 0 points 0 points 0 points 0 points  Repeat phrase 0 points 0 points 0 points 0 points  Total Score 0 points 0 points  0 points    Immunizations Immunization History  Administered Date(s) Administered   Fluad Quad(high Dose 65+) 04/19/2020, 05/04/2021, 07/14/2022   Influenza Split 03/20/2011   Influenza Whole 04/02/2007   Influenza, High Dose Seasonal PF 07/15/2018, 03/20/2019   Influenza, Seasonal, Injecte,  Preservative Fre 04/26/2012   PFIZER(Purple Top)SARS-COV-2 Vaccination 07/10/2019, 08/04/2019, 06/01/2020   Td 05/01/2005   Td (Adult), 2 Lf Tetanus Toxid, Preservative Free 05/01/2005   Tdap 08/01/2016    TDAP status: Up to date  Flu Vaccine status: Up to date  Pneumococcal vaccine status: Due, Education has been provided regarding the importance of this vaccine. Advised may receive this vaccine at local pharmacy or Health Dept. Aware to provide a copy of the vaccination record if obtained from local pharmacy or Health Dept. Verbalized acceptance and understanding.  Covid-19 vaccine  status: Completed vaccines  Qualifies for Shingles Vaccine? Yes   Zostavax completed No   Shingrix Completed?: No.    Education has been provided regarding the importance of this vaccine. Patient has been advised to call insurance company to determine out of pocket expense if they have not yet received this vaccine. Advised may also receive vaccine at local pharmacy or Health Dept. Verbalized acceptance and understanding.  Screening Tests Health Maintenance  Topic Date Due   COVID-19 Vaccine (4 - 2023-24 season) 01/27/2022   Zoster Vaccines- Shingrix (1 of 2) 10/12/2022 (Originally 07/01/2003)   Pneumonia Vaccine 45+ Years old (1 of 1 - PCV) 07/15/2023 (Originally 06/30/2018)   INFLUENZA VACCINE  12/28/2022   Medicare Annual Wellness (AWV)  09/18/2023   DTaP/Tdap/Td (4 - Td or Tdap) 08/02/2026   COLONOSCOPY (Pts 45-16yrs Insurance coverage will need to be confirmed)  05/31/2030   Hepatitis C Screening  Completed   HPV VACCINES  Aged Out    Health Maintenance  Health Maintenance Due  Topic Date Due   COVID-19 Vaccine (4 - 2023-24 season) 01/27/2022    Colorectal cancer screening: Type of screening: Colonoscopy. Completed 05/31/20. Repeat every 10 years  Additional Screening:  Hepatitis C Screening:  Completed 07/14/22  Vision Screening: Recommended annual ophthalmology exams for early detection of glaucoma and other disorders of the eye. Is the patient up to date with their annual eye exam?  Yes  Who is the provider or what is the name of the office in which the patient attends annual eye exams? Dr Rushie Goltz  If pt is not established with a provider, would they like to be referred to a provider to establish care? No .   Dental Screening: Recommended annual dental exams for proper oral hygiene  Community Resource Referral / Chronic Care Management: CRR required this visit?  No   CCM required this visit?  No      Plan:     I have personally reviewed and noted the following  in the patient's chart:   Medical and social history Use of alcohol, tobacco or illicit drugs  Current medications and supplements including opioid prescriptions. Patient is not currently taking opioid prescriptions. Functional ability and status Nutritional status Physical activity Advanced directives List of other physicians Hospitalizations, surgeries, and ER visits in previous 12 months Vitals Screenings to include cognitive, depression, and falls Referrals and appointments  In addition, I have reviewed and discussed with patient certain preventive protocols, quality metrics, and best practice recommendations. A written personalized care plan for preventive services as well as general preventive health recommendations were provided to patient.     Marzella Schlein, LPN   1/61/0960   Nurse Notes:  Pt stated blood pressure has been elevated at home, 174/90 160/80, blood pressure sheet given please advise of further action

## 2022-09-18 NOTE — Patient Instructions (Signed)
John Bowen , Thank you for taking time to come for your Medicare Wellness Visit. I appreciate your ongoing commitment to your health goals. Please review the following plan we discussed and let me know if I can assist you in the future.   These are the goals we discussed:  Goals      Patient Stated     Working on knee to get better         This is a list of the screening recommended for you and due dates:  Health Maintenance  Topic Date Due   COVID-19 Vaccine (4 - 2023-24 season) 01/27/2022   Zoster (Shingles) Vaccine (1 of 2) 10/12/2022*   Pneumonia Vaccine (1 of 1 - PCV) 07/15/2023*   Flu Shot  12/28/2022   Medicare Annual Wellness Visit  09/18/2023   DTaP/Tdap/Td vaccine (4 - Td or Tdap) 08/02/2026   Colon Cancer Screening  05/31/2030   Hepatitis C Screening: USPSTF Recommendation to screen - Ages 18-79 yo.  Completed   HPV Vaccine  Aged Out  *Topic was postponed. The date shown is not the original due date.    Advanced directives: Please bring a copy of your health care power of attorney and living will to the office at your convenience.  Conditions/risks identified: stay healthy  Next appointment: Follow up in one year for your annual wellness visit.   Preventive Care 9 Years and Older, Male  Preventive care refers to lifestyle choices and visits with your health care provider that can promote health and wellness. What does preventive care include? A yearly physical exam. This is also called an annual well check. Dental exams once or twice a year. Routine eye exams. Ask your health care provider how often you should have your eyes checked. Personal lifestyle choices, including: Daily care of your teeth and gums. Regular physical activity. Eating a healthy diet. Avoiding tobacco and drug use. Limiting alcohol use. Practicing safe sex. Taking low doses of aspirin every day. Taking vitamin and mineral supplements as recommended by your health care provider. What  happens during an annual well check? The services and screenings done by your health care provider during your annual well check will depend on your age, overall health, lifestyle risk factors, and family history of disease. Counseling  Your health care provider may ask you questions about your: Alcohol use. Tobacco use. Drug use. Emotional well-being. Home and relationship well-being. Sexual activity. Eating habits. History of falls. Memory and ability to understand (cognition). Work and work Astronomer. Screening  You may have the following tests or measurements: Height, weight, and BMI. Blood pressure. Lipid and cholesterol levels. These may be checked every 5 years, or more frequently if you are over 87 years old. Skin check. Lung cancer screening. You may have this screening every year starting at age 17 if you have a 30-pack-year history of smoking and currently smoke or have quit within the past 15 years. Fecal occult blood test (FOBT) of the stool. You may have this test every year starting at age 51. Flexible sigmoidoscopy or colonoscopy. You may have a sigmoidoscopy every 5 years or a colonoscopy every 10 years starting at age 42. Prostate cancer screening. Recommendations will vary depending on your family history and other risks. Hepatitis C blood test. Hepatitis B blood test. Sexually transmitted disease (STD) testing. Diabetes screening. This is done by checking your blood sugar (glucose) after you have not eaten for a while (fasting). You may have this done every 1-3 years. Abdominal  aortic aneurysm (AAA) screening. You may need this if you are a current or former smoker. Osteoporosis. You may be screened starting at age 3 if you are at high risk. Talk with your health care provider about your test results, treatment options, and if necessary, the need for more tests. Vaccines  Your health care provider may recommend certain vaccines, such as: Influenza vaccine. This  is recommended every year. Tetanus, diphtheria, and acellular pertussis (Tdap, Td) vaccine. You may need a Td booster every 10 years. Zoster vaccine. You may need this after age 27. Pneumococcal 13-valent conjugate (PCV13) vaccine. One dose is recommended after age 69. Pneumococcal polysaccharide (PPSV23) vaccine. One dose is recommended after age 40. Talk to your health care provider about which screenings and vaccines you need and how often you need them. This information is not intended to replace advice given to you by your health care provider. Make sure you discuss any questions you have with your health care provider. Document Released: 06/11/2015 Document Revised: 02/02/2016 Document Reviewed: 03/16/2015 Elsevier Interactive Patient Education  2017 [place] Prevention in the Home Falls can cause injuries. They can happen to people of all ages. There are many things you can do to make your home safe and to help prevent falls. What can I do on the outside of my home? Regularly fix the edges of walkways and driveways and fix any cracks. Remove anything that might make you trip as you walk through a door, such as a raised step or threshold. Trim any bushes or trees on the path to your home. Use bright outdoor lighting. Clear any walking paths of anything that might make someone trip, such as rocks or tools. Regularly check to see if handrails are loose or broken. Make sure that both sides of any steps have handrails. Any raised decks and porches should have guardrails on the edges. Have any leaves, snow, or ice cleared regularly. Use sand or salt on walking paths during winter. Clean up any spills in your garage right away. This includes oil or grease spills. What can I do in the bathroom? Use night lights. Install grab bars by the toilet and in the tub and shower. Do not use towel bars as grab bars. Use non-skid mats or decals in the tub or shower. If you need to sit down  in the shower, use a plastic, non-slip stool. Keep the floor dry. Clean up any water that spills on the floor as soon as it happens. Remove soap buildup in the tub or shower regularly. Attach bath mats securely with double-sided non-slip rug tape. Do not have throw rugs and other things on the floor that can make you trip. What can I do in the bedroom? Use night lights. Make sure that you have a light by your bed that is easy to reach. Do not use any sheets or blankets that are too big for your bed. They should not hang down onto the floor. Have a firm chair that has side arms. You can use this for support while you get dressed. Do not have throw rugs and other things on the floor that can make you trip. What can I do in the kitchen? Clean up any spills right away. Avoid walking on wet floors. Keep items that you use a lot in easy-to-reach places. If you need to reach something above you, use a strong step stool that has a grab bar. Keep electrical cords out of the way. Do not use  floor polish or wax that makes floors slippery. If you must use wax, use non-skid floor wax. Do not have throw rugs and other things on the floor that can make you trip. What can I do with my stairs? Do not leave any items on the stairs. Make sure that there are handrails on both sides of the stairs and use them. Fix handrails that are broken or loose. Make sure that handrails are as long as the stairways. Check any carpeting to make sure that it is firmly attached to the stairs. Fix any carpet that is loose or worn. Avoid having throw rugs at the top or bottom of the stairs. If you do have throw rugs, attach them to the floor with carpet tape. Make sure that you have a light switch at the top of the stairs and the bottom of the stairs. If you do not have them, ask someone to add them for you. What else can I do to help prevent falls? Wear shoes that: Do not have high heels. Have rubber bottoms. Are comfortable  and fit you well. Are closed at the toe. Do not wear sandals. If you use a stepladder: Make sure that it is fully opened. Do not climb a closed stepladder. Make sure that both sides of the stepladder are locked into place. Ask someone to hold it for you, if possible. Clearly mark and make sure that you can see: Any grab bars or handrails. First and last steps. Where the edge of each step is. Use tools that help you move around (mobility aids) if they are needed. These include: Canes. Walkers. Scooters. Crutches. Turn on the lights when you go into a dark area. Replace any light bulbs as soon as they burn out. Set up your furniture so you have a clear path. Avoid moving your furniture around. If any of your floors are uneven, fix them. If there are any pets around you, be aware of where they are. Review your medicines with your doctor. Some medicines can make you feel dizzy. This can increase your chance of falling. Ask your doctor what other things that you can do to help prevent falls. This information is not intended to replace advice given to you by your health care provider. Make sure you discuss any questions you have with your health care provider. Document Released: 03/11/2009 Document Revised: 10/21/2015 Document Reviewed: 06/19/2014 Elsevier Interactive Patient Education  2017 [employer].

## 2022-09-26 ENCOUNTER — Encounter: Payer: Self-pay | Admitting: Family Medicine

## 2022-09-26 ENCOUNTER — Ambulatory Visit (INDEPENDENT_AMBULATORY_CARE_PROVIDER_SITE_OTHER): Payer: Medicare HMO | Admitting: Family Medicine

## 2022-09-26 VITALS — BP 133/73 | HR 56 | Temp 97.7°F | Ht 70.0 in | Wt 182.0 lb

## 2022-09-26 DIAGNOSIS — G4700 Insomnia, unspecified: Secondary | ICD-10-CM

## 2022-09-26 DIAGNOSIS — I10 Essential (primary) hypertension: Secondary | ICD-10-CM | POA: Diagnosis not present

## 2022-09-26 DIAGNOSIS — H353131 Nonexudative age-related macular degeneration, bilateral, early dry stage: Secondary | ICD-10-CM | POA: Diagnosis not present

## 2022-09-26 MED ORDER — AMLODIPINE BESYLATE 5 MG PO TABS: 5.0000 mg | ORAL_TABLET | Freq: Every day | ORAL | 3 refills | Status: DC

## 2022-09-26 NOTE — Patient Instructions (Addendum)
It was very nice to see you today!  We will start amlodipine 5 mg daily.  Continue lisinopril 40 mg daily.  Please follow-up with me in a few weeks to let me know how your blood pressures are reading.  Return if symptoms worsen or fail to improve.   Take care, Dr Jimmey Ralph  PLEASE NOTE:  If you had any lab tests, please let us know if you have not heard back within a few days. You may see your results on mychart before we have a chance to review them but we will give you a call once they are reviewed by [place].   If we ordered any referrals today, please let us know if you have not heard from their office within the next week.   If you had any urgent prescriptions sent in today, please check with the pharmacy within an hour of our visit to make sure the prescription was transmitted appropriately.   Please try these tips to maintain a healthy lifestyle:  Eat at least 3 REAL meals and 1-2 snacks per day.  Aim for no more than 5 hours between eating.  If you eat breakfast, please do so within one hour of getting up.   Each meal should contain half fruits/vegetables, one quarter protein, and one quarter carbs (no bigger than a computer mouse)  Cut down on sweet beverages. This includes juice, soda, and sweet tea.   Drink at least 1 glass of water with each meal and aim for at least 8 glasses per day  Exercise at least 150 minutes every week.

## 2022-09-26 NOTE — Progress Notes (Signed)
   John Bowen is a 67 y.o. male who presents today for an office visit.  Assessment/Plan:  Chronic Problems Addressed Today: Essential hypertension Blood pressure at goal today however home readings have been averaging above range with some elevations into the 170s over 90s.  We will keep him on lisinopril 40 mg daily and add on amlodipine 5 mg daily.  We discussed potential side effects.  We discussed lifestyle modifications including low-sodium diet, limitation of alcohol intake, and high potassium diet.  Also discussed importance of regular cardiovascular exercise at least 30 minutes daily.  He will follow-up with [place] in a couple of weeks and we can titrate meds as needed.  Insomnia Now back on trazodone 150 mg nightly.  Still having quite a bit of issues with sleep.  Do not feel like gabapentin was effective.  He will follow-up with [place] in a few weeks. May consider trial of ramelteon if still having issues.      Subjective:  HPI:  See A/p for status of chronic conditions.  His main concern today is elevated BP.  He is currently on lisinopril 40mg  daily and has been consistent with this. He was here about a month ago with a styue and saw a different provider. BP at that time was elevated. He was instructed to take an extra 20mg  of his lisinopril. He has been doing this the last few months. BP at home has still been fluctuating from the 130s/70s - 170s/90s.  He is not currently having any symptoms of high blood pressure.  At our last visit we also started him on gabapentin to help with insomnia.  He was concerned this is causing elevated blood pressure readings and stopped taking this.  Did not Taloxa effective.  He is now just on trazodone alone.       Objective:  Physical Exam: BP 133/73   Pulse (!) 56   Temp 97.7 F (36.5 C) (Temporal)   Ht 5\' 10"  (1.778 m)   Wt 182 lb (82.6 kg)   SpO2 96%   BMI 26.11 kg/m   Gen: No acute distress, resting comfortably CV: Regular rate and  rhythm with no murmurs appreciated Pulm: Normal work of breathing, clear to auscultation bilaterally with no crackles, wheezes, or rhonchi Neuro: Grossly normal, moves all extremities Psych: Normal affect and thought content      John Bowen M. Jimmey Ralph, MD 09/26/2022 8:24 AM

## 2022-09-26 NOTE — Assessment & Plan Note (Signed)
Now back on trazodone 150 mg nightly.  Still having quite a bit of issues with sleep.  Do not feel like gabapentin was effective.  He will follow-up with [place] in a few weeks. May consider trial of ramelteon if still having issues.

## 2022-09-26 NOTE — Assessment & Plan Note (Signed)
Blood pressure at goal today however home readings have been averaging above range with some elevations into the 170s over 90s.  We will keep him on lisinopril 40 mg daily and add on amlodipine 5 mg daily.  We discussed potential side effects.  We discussed lifestyle modifications including low-sodium diet, limitation of alcohol intake, and high potassium diet.  Also discussed importance of regular cardiovascular exercise at least 30 minutes daily.  He will follow-up with [place] in a couple of weeks and we can titrate meds as needed.

## 2022-10-13 ENCOUNTER — Other Ambulatory Visit: Payer: Self-pay | Admitting: Family Medicine

## 2022-10-16 ENCOUNTER — Other Ambulatory Visit (INDEPENDENT_AMBULATORY_CARE_PROVIDER_SITE_OTHER): Payer: Medicare HMO

## 2022-10-16 DIAGNOSIS — E559 Vitamin D deficiency, unspecified: Secondary | ICD-10-CM | POA: Diagnosis not present

## 2022-10-16 DIAGNOSIS — E538 Deficiency of other specified B group vitamins: Secondary | ICD-10-CM

## 2022-10-17 LAB — VITAMIN B12: Vitamin B-12: 227 pg/mL (ref 211–911)

## 2022-10-17 LAB — VITAMIN D 25 HYDROXY (VIT D DEFICIENCY, FRACTURES): VITD: 32.12 ng/mL (ref 30.00–100.00)

## 2022-10-17 NOTE — Progress Notes (Signed)
Vitamin D and B12 are improving.  He should continue with supplementation and we can recheck again at his next office visit.

## 2022-12-02 ENCOUNTER — Other Ambulatory Visit: Payer: Self-pay | Admitting: Family Medicine

## 2022-12-07 ENCOUNTER — Encounter: Payer: Self-pay | Admitting: Physician Assistant

## 2022-12-07 ENCOUNTER — Ambulatory Visit (INDEPENDENT_AMBULATORY_CARE_PROVIDER_SITE_OTHER): Payer: Medicare HMO | Admitting: Family Medicine

## 2022-12-07 VITALS — BP 138/76 | HR 62 | Temp 97.8°F | Ht 70.0 in | Wt 181.0 lb

## 2022-12-07 DIAGNOSIS — E538 Deficiency of other specified B group vitamins: Secondary | ICD-10-CM

## 2022-12-07 DIAGNOSIS — R059 Cough, unspecified: Secondary | ICD-10-CM

## 2022-12-07 DIAGNOSIS — I10 Essential (primary) hypertension: Secondary | ICD-10-CM

## 2022-12-07 DIAGNOSIS — E559 Vitamin D deficiency, unspecified: Secondary | ICD-10-CM

## 2022-12-07 DIAGNOSIS — U071 COVID-19: Secondary | ICD-10-CM | POA: Diagnosis not present

## 2022-12-07 LAB — POC COVID19 BINAXNOW: SARS Coronavirus 2 Ag: POSITIVE — AB

## 2022-12-07 MED ORDER — NIRMATRELVIR/RITONAVIR (PAXLOVID)TABLET: 3.0000 | ORAL_TABLET | Freq: Two times a day (BID) | ORAL | 0 refills | Status: AC

## 2022-12-07 NOTE — Progress Notes (Signed)
   John Bowen is a 67 y.o. male who presents today for an office visit.  Assessment/Plan:  New/Acute Problems: COVID Rapid COVID-positive.  Reassuring exam today without any signs of respiratory distress.  We discussed treatment options.  He would like to try Paxlovid.  No contraindications to this.  Will send this in.  Discussed good hydration.  He can use over-the-counter meds as needed.  We discussed reasons to return to care or seek emergent care.  We discussed isolation guidelines.   Chronic Problems Addressed Today: Avitaminosis D Last vitamin D at goal.  Continue supplementation 1000 IU daily.  Recheck next office visit.  B12 deficiency Last B12 improving though still on the lower side of normal.  He will continue supplementation 1000 mcg daily.  Recheck next office visit.  Essential hypertension Blood pressure at goal today.  Continue lisinopril 40 mg daily and amlodipine 5 mg daily.     Subjective:  HPI:  See Assessment / plan for status of chronic conditions.  He is here with concern for sinus congestion. This started about 4 days. A lot of sneezing and cough. Some watery and itchy eyes. Runny nose. No fevers or chills. No treatments tried.  He has not tested for COVID.  No known sick contacts.       Objective:  Physical Exam: BP 138/76 (BP Location: Left Arm, Patient Position: Sitting, Cuff Size: Normal)   Pulse 62   Temp 97.8 F (36.6 C) (Temporal)   Ht 5\' 10"  (1.778 m)   Wt 181 lb (82.1 kg)   SpO2 98%   BMI 25.97 kg/m   Gen: No acute distress, resting comfortably HEENT: TMs with clear effusion.  OP erythematous.  No exudates. CV: Regular rate and rhythm with no murmurs appreciated Pulm: Normal work of breathing, clear to auscultation bilaterally with no crackles, wheezes, or rhonchi Neuro: Grossly normal, moves all extremities Psych: Normal affect and thought content      Liborio Saccente M. Jimmey Ralph, MD 12/07/2022 10:40 AM

## 2022-12-07 NOTE — Assessment & Plan Note (Signed)
Last B12 improving though still on the lower side of normal.  He will continue supplementation 1000 mcg daily.  Recheck next office visit.

## 2022-12-07 NOTE — Assessment & Plan Note (Signed)
Last vitamin D at goal.  Continue supplementation 1000 IU daily.  Recheck next office visit.

## 2022-12-07 NOTE — Assessment & Plan Note (Signed)
Blood pressure at goal today.  Continue lisinopril 40 mg daily and amlodipine 5 mg daily.

## 2022-12-13 ENCOUNTER — Other Ambulatory Visit: Payer: Self-pay | Admitting: Family Medicine

## 2022-12-28 ENCOUNTER — Telehealth: Payer: Self-pay | Admitting: Family Medicine

## 2022-12-28 ENCOUNTER — Other Ambulatory Visit: Payer: Self-pay | Admitting: *Deleted

## 2022-12-28 MED ORDER — TRAZODONE HCL 100 MG PO TABS: ORAL_TABLET | ORAL | 0 refills | Status: DC

## 2022-12-28 NOTE — Telephone Encounter (Signed)
Prescription Request  12/28/2022  LOV: 12/07/2022  What is the name of the medication or equipment?  traZODone (DESYREL) 100 MG tablet   Have you contacted your pharmacy to request a refill? Yes   Which pharmacy would you like this sent to?  [facility] [address] [place] - [address] [place] [address] Phone: [phone] Fax: [phone]    Patient notified that their request is being sent to the clinical staff for review and that they should receive a response within 2 business days.   Please advise at Mobile [phone] (mobile)

## 2022-12-28 NOTE — Telephone Encounter (Signed)
Rx send to pharmacy  

## 2023-03-01 ENCOUNTER — Other Ambulatory Visit: Payer: Self-pay | Admitting: Family Medicine

## 2023-03-10 ENCOUNTER — Other Ambulatory Visit: Payer: Self-pay | Admitting: Physician Assistant

## 2023-03-18 ENCOUNTER — Other Ambulatory Visit: Payer: Self-pay | Admitting: Physician Assistant

## 2023-03-20 ENCOUNTER — Other Ambulatory Visit: Payer: Self-pay | Admitting: *Deleted

## 2023-03-20 ENCOUNTER — Other Ambulatory Visit: Payer: Self-pay | Admitting: Physician Assistant

## 2023-03-20 ENCOUNTER — Telehealth: Payer: Self-pay | Admitting: Family Medicine

## 2023-03-20 MED ORDER — TRAZODONE HCL 100 MG PO TABS: ORAL_TABLET | ORAL | 0 refills | Status: DC

## 2023-03-20 NOTE — Telephone Encounter (Signed)
Prescription Request  03/20/2023  LOV: 12/07/2022  What is the name of the medication or equipment? traZODone (DESYREL) 100 MG tablet   PATIENT HAS BEEN OUT SINCE SUNDAY; PHARMACY HAS BEEN SENDING RX REQUEST TO WRONG PROVIDER   Have you contacted your pharmacy to request a refill? Yes   Which pharmacy would you like this sent to?  [facility] [address] [place] - [address] [place] [address] Phone: [phone] Fax: [phone]    Patient notified that their request is being sent to the clinical staff for review and that they should receive a response within 2 business days.   Please advise at Mobile [phone] (mobile)

## 2023-03-20 NOTE — Telephone Encounter (Signed)
Rx send in

## 2023-03-22 NOTE — Telephone Encounter (Signed)
LVM informing pt of med refill status.

## 2023-04-24 ENCOUNTER — Other Ambulatory Visit: Payer: Self-pay | Admitting: Family Medicine

## 2023-05-27 ENCOUNTER — Other Ambulatory Visit: Payer: Self-pay | Admitting: Family Medicine

## 2023-05-30 ENCOUNTER — Other Ambulatory Visit: Payer: Self-pay | Admitting: Family Medicine

## 2023-07-05 ENCOUNTER — Other Ambulatory Visit: Payer: Self-pay | Admitting: Family Medicine

## 2023-08-14 ENCOUNTER — Other Ambulatory Visit: Payer: Self-pay | Admitting: Family Medicine

## 2023-08-24 ENCOUNTER — Other Ambulatory Visit: Payer: Self-pay | Admitting: Family Medicine

## 2023-09-20 ENCOUNTER — Other Ambulatory Visit: Payer: Self-pay | Admitting: Family Medicine

## 2023-09-24 ENCOUNTER — Ambulatory Visit (INDEPENDENT_AMBULATORY_CARE_PROVIDER_SITE_OTHER): Payer: Medicare HMO

## 2023-09-24 ENCOUNTER — Other Ambulatory Visit: Payer: Self-pay | Admitting: Family Medicine

## 2023-09-24 VITALS — BP 138/78 | Temp 98.1°F | Ht 70.5 in | Wt 182.2 lb

## 2023-09-24 DIAGNOSIS — Z0000 Encounter for general adult medical examination without abnormal findings: Secondary | ICD-10-CM | POA: Diagnosis not present

## 2023-09-24 NOTE — Progress Notes (Signed)
 Subjective:   John Bowen is a 68 y.o. who presents for a Medicare Wellness preventive visit.  Visit Complete: In person    Persons Participating in Visit: Patient.  AWV Questionnaire: No: Patient Medicare AWV questionnaire was not completed prior to this visit.  Cardiac Risk Factors include: advanced age (>30men, >61 women);dyslipidemia;hypertension;male gender     Objective:    Today's Vitals   09/24/23 1423 09/24/23 1427  BP: 138/78   Temp: 98.1 F (36.7 C)   Weight: 182 lb 3.2 oz (82.6 kg)   Height: 5' 10.5" (1.791 m)   PainSc:  4    Body mass index is 25.77 kg/m.     09/24/2023    2:34 PM 09/18/2022    3:12 PM 08/29/2021    8:13 AM 08/08/2019    3:33 PM 07/31/2018   10:55 AM 07/24/2018    9:31 AM  Advanced Directives  Does Patient Have a Medical Advance Directive? Yes Yes Yes No No No  Type of Estate agent of [place];Living will Healthcare Power of [place];Living will Healthcare Power of Attorney     Copy of Healthcare Power of Attorney in Chart? No - copy requested No - copy requested No - copy requested     Would patient like information on creating a medical advance directive?    Yes (MAU/Ambulatory/Procedural Areas - Information given) No - Patient declined No - Patient declined    Current Medications (verified) Outpatient Encounter Medications as of 09/24/2023  Medication Sig   amLODipine  (NORVASC ) 5 MG tablet Take 1 tablet by mouth once daily   cyanocobalamin  1000 MCG tablet Take 1,000 mcg by mouth daily.   hydrocortisone  2.5 % cream Apply 1 application  topically daily as needed (itching).   lisinopril  (ZESTRIL ) 40 MG tablet Take 1 tablet by mouth once daily   meloxicam (MOBIC) 15 MG tablet Take 15 mg by mouth daily.   traZODone  (DESYREL ) 100 MG tablet TAKE 1 & 1/2 (ONE & ONE-HALF) TABLETS BY MOUTH AT BEDTIME   Vitamin D , Ergocalciferol , (DRISDOL ) 1.25 MG (50000 UNIT) CAPS capsule Take 1 capsule (50,000 Units total) by mouth every  7 (seven) days.   [DISCONTINUED] atorvastatin  (LIPITOR) 40 MG tablet Take 1 tablet (40 mg total) by mouth daily. (Patient not taking: Reported on 12/07/2022)   No facility-administered encounter medications on file as of 09/24/2023.    Allergies (verified) Penicillins   History: Past Medical History:  Diagnosis Date   Asthma    as a child   Complication of anesthesia    DJD (degenerative joint disease)    knees   Dysfunction of sleep stage or arousal    Hypertension    Meningeal disorder    Meningitis    Zoster   PONV (postoperative nausea and vomiting)    vomiting when got home after arthroscopic  knee surgery   Shingles    Sleep apnea    does not tolerate cpap   Past Surgical History:  Procedure Laterality Date   arthroscopic knee     right   FRACTURE SURGERY       Left wrist and right hand   JOINT REPLACEMENT     left knee 1979 , left knee 2018   KNEE ARTHROSCOPY Left 07/26/2021   scar tissue from prev surgeries   TOTAL KNEE REVISION Left 07/31/2018   Procedure: Left knee polyethylene revision;  Surgeon: Liliane Rei, MD;  Location: [facility];  Service: Orthopedics;  Laterality: Left;    Family History  Problem Relation Age of Onset   Hypertension Other    Social History   Socioeconomic History   Marital status: Married    Spouse name: Not on file   Number of children: 1   Years of education: Not on file   Highest education level: Not on file  Occupational History   Occupation: Retired   Tobacco Use   Smoking status: Never   Smokeless tobacco: Never  Vaping Use   Vaping status: Never Used  Substance and Sexual Activity   Alcohol use: Yes    Alcohol/week: 3.0 standard drinks of alcohol    Types: 3 Cans of beer per week    Comment: a week   Drug use: No   Sexual activity: Yes  Other Topics Concern   Not on file  Social History Narrative   1 Son    Lives with spouse    Social Drivers of Health   Financial Resource Strain: Low Risk   (09/24/2023)   Overall Financial Resource Strain (CARDIA)    Difficulty of Paying Living Expenses: Not hard at all  Food Insecurity: No Food Insecurity (09/24/2023)   Hunger Vital Sign    Worried About Running Out of Food in the Last Year: Never true    Ran Out of Food in the Last Year: Never true  Transportation Needs: No Transportation Needs (09/24/2023)   PRAPARE - Administrator, Civil Service (Medical): No    Lack of Transportation (Non-Medical): No  Physical Activity: Sufficiently Active (09/24/2023)   Exercise Vital Sign    Days of Exercise per Week: 3 days    Minutes of Exercise per Session: 60 min  Stress: No Stress Concern Present (09/24/2023)   [employer] of Occupational Health - Occupational Stress Questionnaire    Feeling of Stress : Not at all  Social Connections: Socially Integrated (09/24/2023)   Social Connection and Isolation Panel [NHANES]    Frequency of Communication with Friends and Family: More than three times a week    Frequency of Social Gatherings with Friends and Family: More than three times a week    Attends Religious Services: More than 4 times per year    Active Member of [employer] or Organizations: Yes    Attends Banker Meetings: 1 to 4 times per year    Marital Status: Married    Tobacco Counseling Counseling given: Not Answered    Clinical Intake:  Pre-visit preparation completed: Yes  Pain : 0-10 Pain Score: 4  Pain Type: Chronic pain Pain Location: Generalized Pain Descriptors / Indicators: Aching Pain Onset: More than a month ago Pain Frequency: Intermittent     BMI - recorded: 25.77 Nutritional Status: BMI 25 -29 Overweight Nutritional Risks: None Diabetes: No  Lab Results  Component Value Date   HGBA1C 5.3 08/27/2019   HGBA1C 5.3 07/15/2018     How often do you need to have someone help you when you read instructions, pamphlets, or other written materials from your doctor or pharmacy?: 1 -  Never  Interpreter Needed?: No  Information entered by :: Lamont Pilsner, LPN   Activities of Daily Living     09/24/2023    2:29 PM  In your present state of health, do you have any difficulty performing the following activities:  Hearing? 0  Vision? 0  Difficulty concentrating or making decisions? 0  Walking or climbing stairs? 1  Comment down at time but can do it  Dressing or bathing? 0  Doing errands,  shopping? 0  Preparing Food and eating ? N  Using the Toilet? N  In the past six months, have you accidently leaked urine? N  Do you have problems with loss of bowel control? N  Managing your Medications? N  Managing your Finances? N  Housekeeping or managing your Housekeeping? N    Patient Care Team: Rodney Clamp, MD as PCP - General (Family Medicine) Liliane Rei, MD as Consulting Physician (Orthopedic Surgery)  Indicate any recent Medical Services you may have received from other than [facility] providers in the past year (date may be approximate).     Assessment:   This is a routine wellness examination for [place].  Hearing/Vision screen Hearing Screening - Comments:: Pt denies any hearing issues Vision Screening - Comments:: Wears rx glasses - up to date with routine eye exams with Dr Frosty Jews     Goals Addressed             This Visit's Progress    Patient Stated       Maintain health and activity        Depression Screen     09/24/2023    2:35 PM 09/18/2022    3:11 PM 07/14/2022    8:11 AM 08/29/2021    8:12 AM 08/23/2020    9:36 AM 08/23/2020    8:20 AM 08/08/2019    3:34 PM  PHQ 2/9 Scores  PHQ - 2 Score 0 0 0 0 0 0 0    Fall Risk     09/24/2023    2:36 PM 09/18/2022    3:13 PM 07/14/2022    8:11 AM 08/29/2021    8:14 AM 08/23/2020    9:39 AM  Fall Risk   Falls in the past year? 0 0 0 0 0  Number falls in past yr: 0 0 0 0 0  Injury with Fall? 0 0 0 0 0  Risk for fall due to : No Fall Risks Impaired vision No Fall Risks Impaired vision  Impaired vision  Follow up Falls prevention discussed Falls prevention discussed  Falls prevention discussed Falls prevention discussed    MEDICARE RISK AT HOME:  Medicare Risk at Home Any stairs in or around the home?: Yes If so, are there any without handrails?: No Home free of loose throw rugs in walkways, pet beds, electrical cords, etc?: Yes Adequate lighting in your home to reduce risk of falls?: Yes Life alert?: No Use of a cane, walker or w/c?: No Grab bars in the bathroom?: No Shower chair or bench in shower?: No Elevated toilet seat or a handicapped toilet?: No  TIMED UP AND GO:  Was the test performed?  Yes  Length of time to ambulate 10 feet: 10 sec Gait steady and fast without use of assistive device  Cognitive Function: 6CIT completed        09/24/2023    2:37 PM 09/18/2022    3:14 PM 08/29/2021    8:16 AM 08/23/2020    9:43 AM 08/08/2019    3:34 PM  6CIT Screen  What Year? 0 points 0 points 0 points 0 points 0 points  What month? 0 points 0 points 0 points 0 points 0 points  What time? 0 points 0 points 0 points  0 points  Count back from 20 0 points 0 points 0 points 0 points 0 points  Months in reverse 0 points 0 points 0 points 0 points 0 points  Repeat phrase 0 points 0 points 0  points 0 points 0 points  Total Score 0 points 0 points 0 points  0 points    Immunizations Immunization History  Administered Date(s) Administered   Fluad Quad(high Dose 65+) 04/19/2020, 05/04/2021, 07/14/2022   Influenza Split 03/20/2011   Influenza Whole 04/02/2007   Influenza, High Dose Seasonal PF 07/15/2018, 03/20/2019   Influenza, Seasonal, Injecte, Preservative Fre 04/26/2012   PFIZER(Purple Top)SARS-COV-2 Vaccination 07/10/2019, 08/04/2019, 06/01/2020   Td 05/01/2005   Td (Adult), 2 Lf Tetanus Toxid, Preservative Free 05/01/2005   Tdap 08/01/2016    Screening Tests Health Maintenance  Topic Date Due   Pneumonia Vaccine 79+ Years old (1 of 1 - PCV) Never done    Zoster Vaccines- Shingrix (1 of 2) Never done   COVID-19 Vaccine (4 - 2024-25 season) 01/28/2023   INFLUENZA VACCINE  12/28/2023   Medicare Annual Wellness (AWV)  09/23/2024   DTaP/Tdap/Td (4 - Td or Tdap) 08/02/2026   Colonoscopy  05/31/2030   Hepatitis C Screening  Completed   HPV VACCINES  Aged Out   Meningococcal B Vaccine  Aged Out    Health Maintenance  Health Maintenance Due  Topic Date Due   Pneumonia Vaccine 81+ Years old (1 of 1 - PCV) Never done   Zoster Vaccines- Shingrix (1 of 2) Never done   COVID-19 Vaccine (4 - 2024-25 season) 01/28/2023   Health Maintenance Items Addressed: See Nurse Notes  Additional Screening:  Vision Screening: Recommended annual ophthalmology exams for early detection of glaucoma and other disorders of the eye.  Dental Screening: Recommended annual dental exams for proper oral hygiene  Community Resource Referral / Chronic Care Management: CRR required this visit?  No   CCM required this visit?  No     Plan:     I have personally reviewed and noted the following in the patient's chart:   Medical and social history Use of alcohol, tobacco or illicit drugs  Current medications and supplements including opioid prescriptions. Patient is not currently taking opioid prescriptions. Functional ability and status Nutritional status Physical activity Advanced directives List of other physicians Hospitalizations, surgeries, and ER visits in previous 12 months Vitals Screenings to include cognitive, depression, and falls Referrals and appointments  In addition, I have reviewed and discussed with patient certain preventive protocols, quality metrics, and best practice recommendations. A written personalized care plan for preventive services as well as general preventive health recommendations were provided to patient.     Bruno Capri, LPN   01/25/5620   After Visit Summary: (MyChart) Due to this being a telephonic visit, the  after visit summary with patients personalized plan was offered to patient via MyChart   Notes: Nothing significant to report at this time.

## 2023-09-24 NOTE — Patient Instructions (Signed)
 Mr. John Bowen , Thank you for taking time to come for your Medicare Wellness Visit. I appreciate your ongoing commitment to your health goals. Please review the following plan we discussed and let me know if I can assist you in the future.   Referrals/Orders/Follow-Ups/Clinician Recommendations: Aim for 30 minutes of exercise or brisk walking, 6-8 glasses of water, and 5 servings of fruits and vegetables each day. Maintain health and activity   This is a list of the screening recommended for you and due dates:  Health Maintenance  Topic Date Due   Pneumonia Vaccine (1 of 1 - PCV) Never done   Zoster (Shingles) Vaccine (1 of 2) Never done   COVID-19 Vaccine (4 - 2024-25 season) 01/28/2023   Flu Shot  12/28/2023   Medicare Annual Wellness Visit  09/23/2024   DTaP/Tdap/Td vaccine (4 - Td or Tdap) 08/02/2026   Colon Cancer Screening  05/31/2030   Hepatitis C Screening  Completed   HPV Vaccine  Aged Out   Meningitis B Vaccine  Aged Out    Advanced directives: (Copy Requested) Please bring a copy of your health care power of attorney and living will to the office to be added to your chart at your convenience. You can mail to [facility] [address] [place], [place] [address] or email to [email or web address]  Next Medicare Annual Wellness Visit scheduled for next year: Yes

## 2023-10-26 ENCOUNTER — Other Ambulatory Visit: Payer: Self-pay | Admitting: Family Medicine

## 2023-11-05 DIAGNOSIS — Z96652 Presence of left artificial knee joint: Secondary | ICD-10-CM | POA: Diagnosis not present

## 2023-11-15 DIAGNOSIS — M25662 Stiffness of left knee, not elsewhere classified: Secondary | ICD-10-CM | POA: Diagnosis not present

## 2023-11-15 DIAGNOSIS — Z96652 Presence of left artificial knee joint: Secondary | ICD-10-CM | POA: Diagnosis not present

## 2023-11-16 ENCOUNTER — Other Ambulatory Visit: Payer: Self-pay | Admitting: Family Medicine

## 2023-12-05 ENCOUNTER — Other Ambulatory Visit: Payer: Self-pay | Admitting: Family Medicine

## 2023-12-06 DIAGNOSIS — Z96652 Presence of left artificial knee joint: Secondary | ICD-10-CM | POA: Diagnosis not present

## 2023-12-06 DIAGNOSIS — M79671 Pain in right foot: Secondary | ICD-10-CM | POA: Diagnosis not present

## 2023-12-14 DIAGNOSIS — M25562 Pain in left knee: Secondary | ICD-10-CM | POA: Diagnosis not present

## 2023-12-14 DIAGNOSIS — M25662 Stiffness of left knee, not elsewhere classified: Secondary | ICD-10-CM | POA: Diagnosis not present

## 2023-12-17 ENCOUNTER — Other Ambulatory Visit: Payer: Self-pay | Admitting: Family Medicine

## 2023-12-17 DIAGNOSIS — M25662 Stiffness of left knee, not elsewhere classified: Secondary | ICD-10-CM | POA: Diagnosis not present

## 2023-12-17 DIAGNOSIS — M25562 Pain in left knee: Secondary | ICD-10-CM | POA: Diagnosis not present

## 2023-12-19 DIAGNOSIS — M25662 Stiffness of left knee, not elsewhere classified: Secondary | ICD-10-CM | POA: Diagnosis not present

## 2023-12-19 DIAGNOSIS — M25562 Pain in left knee: Secondary | ICD-10-CM | POA: Diagnosis not present

## 2023-12-24 DIAGNOSIS — M25562 Pain in left knee: Secondary | ICD-10-CM | POA: Diagnosis not present

## 2023-12-24 DIAGNOSIS — M25662 Stiffness of left knee, not elsewhere classified: Secondary | ICD-10-CM | POA: Diagnosis not present

## 2023-12-26 DIAGNOSIS — D3611 Benign neoplasm of peripheral nerves and autonomic nervous system of face, head, and neck: Secondary | ICD-10-CM | POA: Diagnosis not present

## 2023-12-26 DIAGNOSIS — L218 Other seborrheic dermatitis: Secondary | ICD-10-CM | POA: Diagnosis not present

## 2023-12-26 DIAGNOSIS — D3617 Benign neoplasm of peripheral nerves and autonomic nervous system of trunk, unspecified: Secondary | ICD-10-CM | POA: Diagnosis not present

## 2023-12-26 DIAGNOSIS — L309 Dermatitis, unspecified: Secondary | ICD-10-CM | POA: Diagnosis not present

## 2023-12-26 DIAGNOSIS — L821 Other seborrheic keratosis: Secondary | ICD-10-CM | POA: Diagnosis not present

## 2023-12-26 DIAGNOSIS — D1801 Hemangioma of skin and subcutaneous tissue: Secondary | ICD-10-CM | POA: Diagnosis not present

## 2023-12-26 DIAGNOSIS — L812 Freckles: Secondary | ICD-10-CM | POA: Diagnosis not present

## 2024-01-07 DIAGNOSIS — M7741 Metatarsalgia, right foot: Secondary | ICD-10-CM | POA: Diagnosis not present

## 2024-01-07 DIAGNOSIS — M2041 Other hammer toe(s) (acquired), right foot: Secondary | ICD-10-CM | POA: Diagnosis not present

## 2024-01-09 ENCOUNTER — Other Ambulatory Visit: Payer: Self-pay | Admitting: Family Medicine

## 2024-01-17 DIAGNOSIS — M545 Low back pain, unspecified: Secondary | ICD-10-CM | POA: Diagnosis not present

## 2024-01-17 DIAGNOSIS — Z96652 Presence of left artificial knee joint: Secondary | ICD-10-CM | POA: Diagnosis not present

## 2024-02-12 ENCOUNTER — Other Ambulatory Visit: Payer: Self-pay | Admitting: Family Medicine

## 2024-02-13 ENCOUNTER — Other Ambulatory Visit: Payer: Self-pay | Admitting: Family Medicine

## 2024-02-18 DIAGNOSIS — M7741 Metatarsalgia, right foot: Secondary | ICD-10-CM | POA: Diagnosis not present

## 2024-02-18 DIAGNOSIS — M2041 Other hammer toe(s) (acquired), right foot: Secondary | ICD-10-CM | POA: Diagnosis not present

## 2024-03-16 ENCOUNTER — Other Ambulatory Visit: Payer: Self-pay | Admitting: Family Medicine

## 2024-03-23 ENCOUNTER — Other Ambulatory Visit: Payer: Self-pay | Admitting: Family Medicine

## 2024-05-11 ENCOUNTER — Other Ambulatory Visit: Payer: Self-pay | Admitting: Family Medicine

## 2024-05-15 ENCOUNTER — Ambulatory Visit: Admitting: Family Medicine

## 2024-05-15 ENCOUNTER — Encounter: Payer: Self-pay | Admitting: Family Medicine

## 2024-05-15 VITALS — BP 128/78 | HR 63 | Temp 98.1°F | Ht 70.0 in | Wt 184.0 lb

## 2024-05-15 DIAGNOSIS — G4700 Insomnia, unspecified: Secondary | ICD-10-CM | POA: Diagnosis not present

## 2024-05-15 DIAGNOSIS — E559 Vitamin D deficiency, unspecified: Secondary | ICD-10-CM | POA: Diagnosis not present

## 2024-05-15 DIAGNOSIS — E538 Deficiency of other specified B group vitamins: Secondary | ICD-10-CM

## 2024-05-15 DIAGNOSIS — I10 Essential (primary) hypertension: Secondary | ICD-10-CM

## 2024-05-15 DIAGNOSIS — Z0001 Encounter for general adult medical examination with abnormal findings: Secondary | ICD-10-CM

## 2024-05-15 DIAGNOSIS — E785 Hyperlipidemia, unspecified: Secondary | ICD-10-CM

## 2024-05-15 DIAGNOSIS — Z125 Encounter for screening for malignant neoplasm of prostate: Secondary | ICD-10-CM

## 2024-05-15 DIAGNOSIS — Z23 Encounter for immunization: Secondary | ICD-10-CM | POA: Diagnosis not present

## 2024-05-15 LAB — COMPREHENSIVE METABOLIC PANEL WITH GFR
ALT: 20 U/L (ref 3–53)
AST: 18 U/L (ref 5–37)
Albumin: 4.6 g/dL (ref 3.5–5.2)
Alkaline Phosphatase: 80 U/L (ref 39–117)
BUN: 15 mg/dL (ref 6–23)
CO2: 31 meq/L (ref 19–32)
Calcium: 9.1 mg/dL (ref 8.4–10.5)
Chloride: 105 meq/L (ref 96–112)
Creatinine, Ser: 0.88 mg/dL (ref 0.40–1.50)
GFR: 86.9 mL/min (ref >60.00)
Glucose, Bld: 85 mg/dL (ref 70–99)
Potassium: 4.5 meq/L (ref 3.5–5.1)
Sodium: 143 meq/L (ref 135–145)
Total Bilirubin: 0.5 mg/dL (ref 0.2–1.2)
Total Protein: 6.8 g/dL (ref 6.0–8.3)

## 2024-05-15 LAB — CBC
HCT: 43.4 % (ref 39.0–52.0)
Hemoglobin: 14.6 g/dL (ref 13.0–17.0)
MCHC: 33.6 g/dL (ref 30.0–36.0)
MCV: 90.6 fl (ref 78.0–100.0)
Platelets: 291 K/uL (ref 150.0–400.0)
RBC: 4.79 Mil/uL (ref 4.22–5.81)
RDW: 13.4 % (ref 11.5–15.5)
WBC: 4.8 K/uL (ref 4.0–10.5)

## 2024-05-15 LAB — LIPID PANEL
Cholesterol: 187 mg/dL (ref 28–200)
HDL: 62.6 mg/dL (ref >39.00)
LDL Cholesterol: 114 mg/dL — ABNORMAL HIGH (ref 10–99)
NonHDL: 124.23
Total CHOL/HDL Ratio: 3
Triglycerides: 51 mg/dL (ref 10.0–149.0)
VLDL: 10.2 mg/dL (ref 0.0–40.0)

## 2024-05-15 LAB — VITAMIN B12: Vitamin B-12: 269 pg/mL (ref 211–911)

## 2024-05-15 LAB — VITAMIN D 25 HYDROXY (VIT D DEFICIENCY, FRACTURES): VITD: 30.15 ng/mL (ref 30.00–100.00)

## 2024-05-15 LAB — TSH: TSH: 0.96 u[IU]/mL (ref 0.35–5.50)

## 2024-05-15 LAB — PSA: PSA: 1.74 ng/mL (ref 0.10–4.00)

## 2024-05-15 MED ORDER — TRAZODONE HCL 100 MG PO TABS: ORAL_TABLET | ORAL | 3 refills | Status: AC

## 2024-05-15 MED ORDER — LISINOPRIL 40 MG PO TABS: 40.0000 mg | ORAL_TABLET | Freq: Every day | ORAL | 3 refills | Status: AC

## 2024-05-15 MED ORDER — AMLODIPINE BESYLATE 5 MG PO TABS: 5.0000 mg | ORAL_TABLET | Freq: Every day | ORAL | 3 refills | Status: AC

## 2024-05-15 NOTE — Patient Instructions (Signed)
 It was very nice to see you today!  VISIT SUMMARY: Today, we addressed your ongoing sleep difficulties and high blood pressure. We also discussed your concerns about hair loss and administered a flu vaccine.  YOUR PLAN: INSOMNIA: You have had trouble falling asleep for many years, and previous medications have not been effective or caused side effects. -Adjust the timing of trazodone  to 30 minutes to 1 hour before bedtime. -If this does not help, we may consider trying Seroquel. -We may refer you to a sleep specialist if needed.  ESSENTIAL HYPERTENSION: Your blood pressure was high today because you missed a dose of your medication. Your usual readings range from 145/80 to 120/80 mmHg. -Continue taking lisinopril  and amlodipine  as prescribed. -Monitor your blood pressure at home and report the readings via MyChart in a couple of weeks.  GENERAL HEALTH MAINTENANCE: We discussed your vaccinations today. -You received a flu vaccine today. -Consider getting pneumonia and shingles vaccines at a later date.  Return in about 1 year (around 05/15/2025) for Annual Physical.   Take care, Dr Kennyth  PLEASE NOTE:  If you had any lab tests, please let us  know if you have not heard back within a few days. You may see your results on mychart before we have a chance to review them but we will give you a call once they are reviewed by us .   If we ordered any referrals today, please let us  know if you have not heard from their office within the next week.   If you had any urgent prescriptions sent in today, please check with the pharmacy within an hour of our visit to make sure the prescription was transmitted appropriately.   Please try these tips to maintain a healthy lifestyle:  Eat at least 3 REAL meals and 1-2 snacks per day.  Aim for no more than 5 hours between eating.  If you eat breakfast, please do so within one hour of getting up.   Each meal should contain half fruits/vegetables, one  quarter protein, and one quarter carbs (no bigger than a computer mouse)  Cut down on sweet beverages. This includes juice, soda, and sweet tea.   Drink at least 1 glass of water with each meal and aim for at least 8 glasses per day  Exercise at least 150 minutes every week.    Preventive Care 87 Years and Older, Male Preventive care refers to lifestyle choices and visits with your health care provider that can promote health and wellness. Preventive care visits are also called wellness exams. What can I expect for my preventive care visit? Counseling During your preventive care visit, your health care provider may ask about your: Medical history, including: Past medical problems. Family medical history. History of falls. Current health, including: Emotional well-being. Home life and relationship well-being. Sexual activity. Memory and ability to understand (cognition). Lifestyle, including: Alcohol, nicotine or tobacco, and drug use. Access to firearms. Diet, exercise, and sleep habits. Work and work astronomer. Sunscreen use. Safety issues such as seatbelt and bike helmet use. Physical exam Your health care provider will check your: Height and weight. These may be used to calculate your BMI (body mass index). BMI is a measurement that tells if you are at a healthy weight. Waist circumference. This measures the distance around your waistline. This measurement also tells if you are at a healthy weight and may help predict your risk of certain diseases, such as type 2 diabetes and high blood pressure. Heart rate and  blood pressure. Body temperature. Skin for abnormal spots. What immunizations do I need?  Vaccines are usually given at various ages, according to a schedule. Your health care provider will recommend vaccines for you based on your age, medical history, and lifestyle or other factors, such as travel or where you work. What tests do I need? Screening Your health  care provider may recommend screening tests for certain conditions. This may include: Lipid and cholesterol levels. Diabetes screening. This is done by checking your blood sugar (glucose) after you have not eaten for a while (fasting). Hepatitis C test. Hepatitis B test. HIV (human immunodeficiency virus) test. STI (sexually transmitted infection) testing, if you are at risk. Lung cancer screening. Colorectal cancer screening. Prostate cancer screening. Abdominal aortic aneurysm (AAA) screening. You may need this if you are a current or former smoker. Talk with your health care provider about your test results, treatment options, and if necessary, the need for more tests. Follow these instructions at home: Eating and drinking  Eat a diet that includes fresh fruits and vegetables, whole grains, lean protein, and low-fat dairy products. Limit your intake of foods with high amounts of sugar, saturated fats, and salt. Take vitamin and mineral supplements as recommended by your health care provider. Do not drink alcohol if your health care provider tells you not to drink. If you drink alcohol: Limit how much you have to 0-2 drinks a day. Know how much alcohol is in your drink. In the [place] one drink equals one 12 oz bottle of beer (355 mL), one 5 oz glass of wine (148 mL), or one 1 oz glass of hard liquor (44 mL). Lifestyle Brush your teeth every morning and night with fluoride  toothpaste. Floss one time each day. Exercise for at least 30 minutes 5 or more days each week. Do not use any products that contain nicotine or tobacco. These products include cigarettes, chewing tobacco, and vaping devices, such as e-cigarettes. If you need help quitting, ask your health care provider. Do not use drugs. If you are sexually active, practice safe sex. Use a condom or other form of protection to prevent STIs. Take aspirin  only as told by your health care provider. Make sure that you understand how much  to take and what form to take. Work with your health care provider to find out whether it is safe and beneficial for you to take aspirin  daily. Ask your health care provider if you need to take a cholesterol-lowering medicine (statin). Find healthy ways to manage stress, such as: Meditation, yoga, or listening to music. Journaling. Talking to a trusted person. Spending time with friends and family. Safety Always wear your seat belt while driving or riding in a vehicle. Do not drive: If you have been drinking alcohol. Do not ride with someone who has been drinking. When you are tired or distracted. While texting. If you have been using any mind-altering substances or drugs. Wear a helmet and other protective equipment during sports activities. If you have firearms in your house, make sure you follow all gun safety procedures. Minimize exposure to UV radiation to reduce your risk of skin cancer. What's next? Visit your health care provider once a year for an annual wellness visit. Ask your health care provider how often you should have your eyes and teeth checked. Stay up to date on all vaccines. This information is not intended to replace advice given to you by your health care provider. Make sure you discuss any questions you have  with your health care provider. Document Revised: 11/10/2020 Document Reviewed: 11/10/2020 Elsevier Patient Education  2024 [employer].

## 2024-05-15 NOTE — Assessment & Plan Note (Signed)
 Check B12

## 2024-05-15 NOTE — Progress Notes (Signed)
 Chief Complaint:  John Bowen is a 68 y.o. male who presents today for his annual comprehensive physical exam.    Assessment/Plan:  Chronic Problems Addressed Today: Dyslipidemia Check lipids.   Avitaminosis D Check Vitamin D .   B12 deficiency Check B12  Insomnia Symptoms are not fully controlled with trazodone  150 mg nightly.  We did discuss trial of alternative medication however he would like to try adjusting dose timing of trazodone  first.  He will try taking this 30 minutes to an hour before bedtime to see if this better controls his symptoms.  If no improvement this would consider a trial of Seroquel.  He has been on other medications in the past including gabapentin  and Ambien which were not effective.  Essential hypertension Initially elevated today  Preventative Healthcare: Flu shot given today.  Check labs.  Declined shingles and pneumonia vaccine though he can get these at the pharmacy.  Up-to-date on colon cancer screening.  Patient Counseling(The following topics were reviewed and/or handout was given):  -Nutrition: Stressed importance of moderation in sodium/caffeine intake, saturated fat and cholesterol, caloric balance, sufficient intake of fresh fruits, vegetables, and fiber.  -Stressed the importance of regular exercise.   -Substance Abuse: Discussed cessation/primary prevention of tobacco, alcohol, or other drug use; driving or other dangerous activities under the influence; availability of treatment for abuse.   -Injury prevention: Discussed safety belts, safety helmets, smoke detector, smoking near bedding or upholstery.   -Sexuality: Discussed sexually transmitted diseases, partner selection, use of condoms, avoidance of unintended pregnancy and contraceptive alternatives.   -Dental health: Discussed importance of regular tooth brushing, flossing, and dental visits.  -Health maintenance and immunizations reviewed. Please refer to Health maintenance  section.  Return to care in 1 year for next preventative visit.     Subjective:  HPI:  He has no acute complaints today. Patient is here today for his annual physical.  See assessment / plan for status of chronic conditions.  Discussed the use of AI scribe software for clinical note transcription with the patient, who gave verbal consent to proceed.  History of Present Illness John Bowen is a 68 year old male with hypertension and sleep difficulties who presents for medication refills and evaluation of sleep issues.  He has experienced chronic difficulty falling asleep for approximately ten to fifteen years. Despite using trazodone , he continues to have trouble initiating sleep. Previous trials of hydroxyzine , Ambien, and gabapentin  were ineffective or caused undesirable side effects such as bizarre dreams. He takes his medications at night before bed. He reports that his primary issue is with falling asleep, and he does not have significant issues with waking up and being unable to get back to sleep.  He has a history of sleep apnea and has previously undergone a sleep study. He reports losing a significant amount of weight since the diagnosis but was unable to tolerate the CPAP machine due to discomfort.  He forgot to take his blood pressure medication last night, which he believes has caused an elevation in his blood pressure today. He has been on lisinopril  and amlodipine  for blood pressure management. His blood pressure typically fluctuates between 145/80 and 120/80 when checked regularly, though he has not checked it in the past four to five months.      05/15/2024   11:44 AM  Depression screen PHQ 2/9  Decreased Interest 0  Down, Depressed, Hopeless 0  PHQ - 2 Score 0    There are no preventive care reminders  to display for this patient.   ROS: Per HPI, otherwise a complete review of systems was negative.   PMH:  The following were reviewed and entered/updated in  epic: Past Medical History:  Diagnosis Date   Asthma    as a child   Complication of anesthesia    DJD (degenerative joint disease)    knees   Dysfunction of sleep stage or arousal    Hypertension    Meningeal disorder    Meningitis    Zoster   PONV (postoperative nausea and vomiting)    vomiting when got home after arthroscopic  knee surgery   Shingles    Sleep apnea    does not tolerate cpap   Patient Active Problem List   Diagnosis Date Noted   Avitaminosis D 07/14/2022   B12 deficiency 07/14/2022   Dyslipidemia 07/17/2018   OSA (obstructive sleep apnea) 09/06/2016   Peyronie disease 06/13/2016   Osteoarthritis of left knee 04/20/2014   Essential hypertension 05/25/2008   Insomnia 04/02/2007   Past Surgical History:  Procedure Laterality Date   arthroscopic knee     right   FRACTURE SURGERY       Left wrist and right hand   JOINT REPLACEMENT     left knee 1979 , left knee 2018   KNEE ARTHROSCOPY Left 07/26/2021   scar tissue from prev surgeries   TOTAL KNEE REVISION Left 07/31/2018   Procedure: Left knee polyethylene revision;  Surgeon: Melodi Lerner, MD;  Location: [facility];  Service: Orthopedics;  Laterality: Left;     Family History  Problem Relation Age of Onset   Hypertension Other     Medications- reviewed and updated Current Outpatient Medications  Medication Sig Dispense Refill   cyanocobalamin  1000 MCG tablet Take 1,000 mcg by mouth daily.     hydrocortisone  2.5 % cream Apply 1 application  topically daily as needed (itching).     meloxicam (MOBIC) 15 MG tablet Take 15 mg by mouth daily.     Vitamin D , Ergocalciferol , (DRISDOL ) 1.25 MG (50000 UNIT) CAPS capsule Take 1 capsule (50,000 Units total) by mouth every 7 (seven) days. 12 capsule 0   amLODipine  (NORVASC ) 5 MG tablet Take 1 tablet (5 mg total) by mouth daily. 90 tablet 3   lisinopril  (ZESTRIL ) 40 MG tablet Take 1 tablet (40 mg total) by mouth daily. 90 tablet 3   traZODone  (DESYREL )  100 MG tablet TAKE 1 & 1/2 (ONE & ONE-HALF) TABLETS BY MOUTH AT BEDTIME 135 tablet 3   No current facility-administered medications for this visit.    Allergies-reviewed and updated Allergies[1]  Social History   Socioeconomic History   Marital status: Married    Spouse name: Not on file   Number of children: 1   Years of education: Not on file   Highest education level: Not on file  Occupational History   Occupation: Retired   Tobacco Use   Smoking status: Never   Smokeless tobacco: Never  Vaping Use   Vaping status: Never Used  Substance and Sexual Activity   Alcohol use: Yes    Alcohol/week: 3.0 standard drinks of alcohol    Types: 3 Cans of beer per week    Comment: a week   Drug use: No   Sexual activity: Yes  Other Topics Concern   Not on file  Social History Narrative   1 Son    Lives with spouse    Social Drivers of Health   Tobacco Use: Low Risk (  05/15/2024)   Patient History    Smoking Tobacco Use: Never    Smokeless Tobacco Use: Never    Passive Exposure: Not on file  Financial Resource Strain: Low Risk (09/24/2023)   Overall Financial Resource Strain (CARDIA)    Difficulty of Paying Living Expenses: Not hard at all  Food Insecurity: No Food Insecurity (09/24/2023)   Hunger Vital Sign    Worried About Running Out of Food in the Last Year: Never true    Ran Out of Food in the Last Year: Never true  Transportation Needs: No Transportation Needs (09/24/2023)   PRAPARE - Administrator, Civil Service (Medical): No    Lack of Transportation (Non-Medical): No  Physical Activity: Sufficiently Active (09/24/2023)   Exercise Vital Sign    Days of Exercise per Week: 3 days    Minutes of Exercise per Session: 60 min  Stress: No Stress Concern Present (09/24/2023)   [employer] of Occupational Health - Occupational Stress Questionnaire    Feeling of Stress : Not at all  Social Connections: Socially Integrated (09/24/2023)   Social Connection and  Isolation Panel    Frequency of Communication with Friends and Family: More than three times a week    Frequency of Social Gatherings with Friends and Family: More than three times a week    Attends Religious Services: More than 4 times per year    Active Member of Clubs or Organizations: Yes    Attends Banker Meetings: 1 to 4 times per year    Marital Status: Married  Depression (PHQ2-9): Low Risk (05/15/2024)   Depression (PHQ2-9)    PHQ-2 Score: 0  Alcohol Screen: Low Risk (09/24/2023)   Alcohol Screen    Last Alcohol Screening Score (AUDIT): 1  Housing: Unknown (09/24/2023)   Housing Stability Vital Sign    Unable to Pay for Housing in the Last Year: No    Number of Times Moved in the Last Year: Not on file    Homeless in the Last Year: No  Utilities: Not At Risk (09/24/2023)   A[employer] Utilities    Threatened with loss of utilities: No  Health Literacy: Adequate Health Literacy (09/24/2023)   B1300 Health Literacy    Frequency of need for help with medical instructions: Never        Objective:  Physical Exam: BP 128/78   Pulse 63   Temp 98.1 F (36.7 C) (Temporal)   Ht 5' 10 (1.778 m)   Wt 184 lb (83.5 kg)   SpO2 99%   BMI 26.40 kg/m   Body mass index is 26.4 kg/m. Wt Readings from Last 3 Encounters:  05/15/24 184 lb (83.5 kg)  09/24/23 182 lb 3.2 oz (82.6 kg)  12/07/22 181 lb (82.1 kg)   Gen: NAD, resting comfortably HEENT: TMs normal bilaterally. OP clear. No thyromegaly noted.  CV: RRR with no murmurs appreciated Pulm: NWOB, CTAB with no crackles, wheezes, or rhonchi GI: Normal bowel sounds present. Soft, Nontender, Nondistended. MSK: no edema, cyanosis, or clubbing noted Skin: warm, dry Neuro: CN2-12 grossly intact. Strength 5/5 in upper and lower extremities. Reflexes symmetric and intact bilaterally.  Psych: Normal affect and thought content     Briyana Badman M. Kennyth, MD 05/15/2024 12:32 PM     [1]  Allergies Allergen Reactions   Penicillins

## 2024-05-15 NOTE — Assessment & Plan Note (Signed)
 Check lipids

## 2024-05-15 NOTE — Assessment & Plan Note (Signed)
 Check Vitamin D

## 2024-05-15 NOTE — Assessment & Plan Note (Signed)
 Symptoms are not fully controlled with trazodone  150 mg nightly.  We did discuss trial of alternative medication however he would like to try adjusting dose timing of trazodone  first.  He will try taking this 30 minutes to an hour before bedtime to see if this better controls his symptoms.  If no improvement this would consider a trial of Seroquel.  He has been on other medications in the past including gabapentin  and Ambien which were not effective.

## 2024-05-15 NOTE — Assessment & Plan Note (Signed)
Initially elevated today.

## 2024-05-16 ENCOUNTER — Ambulatory Visit: Payer: Self-pay | Admitting: Family Medicine

## 2024-05-16 DIAGNOSIS — E785 Hyperlipidemia, unspecified: Secondary | ICD-10-CM

## 2024-05-16 NOTE — Progress Notes (Signed)
 His cholesterol is elevated.  Recommend starting Lipitor 40 mg daily to improve his numbers and lower risk of heart attack and stroke.  Please send in prescription if he is agreeable.  All of his other labs are at goal.  Do not need to make any other changes to his treatment plan at this time.  He should continue to work on diet and exercise and we can recheck everything else in a year or so.

## 2024-07-02 ENCOUNTER — Encounter: Payer: Self-pay | Admitting: Family Medicine

## 2024-07-02 ENCOUNTER — Ambulatory Visit: Admitting: Family Medicine

## 2024-07-02 VITALS — BP 118/70 | HR 55 | Temp 98.4°F | Resp 16 | Ht 70.0 in | Wt 183.4 lb

## 2024-07-02 DIAGNOSIS — H109 Unspecified conjunctivitis: Secondary | ICD-10-CM

## 2024-07-02 MED ORDER — ERYTHROMYCIN 5 MG/GM OP OINT: 1.0000 | TOPICAL_OINTMENT | Freq: Three times a day (TID) | OPHTHALMIC | 0 refills | Status: AC

## 2024-07-02 NOTE — Progress Notes (Signed)
 "  Subjective  CC:  Chief Complaint  Patient presents with   Conjunctivitis    Started this morning.    Same day acute visit; PCP not available. New pt to me. Chart reviewed.   HPI: John Bowen is a 69 y.o. male who presents to the office today to address the problems listed above in the chief complaint. Discussed the use of AI scribe software for clinical note transcription with the patient, who gave verbal consent to proceed.  History of Present Illness John Bowen is a 69 year old male who presents with a swollen, red, and itchy eye.  Ocular symptoms - Acute onset of swollen, red, and runny eye upon awakening - Scratchy and itchy sensation in the affected eye - Ocular drainage present - No significant eye pain, but irritation and swelling noted - No known exposure to individuals with conjunctivitis - Attended a large luncheon the previous day  Topical exposure - Uses aloe on face for psoriasis - Suspects possible contribution of aloe to current eye irritation if it entered the eye - Has used aloe previously without issues - Uncertain if aloe is related to current symptoms  Prior episodes and management - History of similar ocular symptoms in the past - Previous episodes resolved with hot compresses - Has used antibiotic ointment for similar eye issues in the past - Inquires about the safety of using antibiotic ointment that is two years old  Associated and systemic symptoms - No cold symptoms - Generalized body aches after shoveling snow - No significant changes in overall health   Assessment  1. Conjunctivitis, bacterial      Plan  Assessment and Plan Assessment & Plan Bacterial conjunctivitis Acute bacterial conjunctivitis with symptoms of swollen, red, itchy, and draining eye. Possible exposure at a recent event. Differential includes mechanical or chemical irritation from aloe use. No significant eye pain reported. Previous similar episodes treated  with antibiotic ointment. - Prescribed antibiotic ointment for bacterial conjunctivitis.  Erythromycin  ophthalmic ointment - Advised application of ointment on the eyelid to cover for bacterial infection. - Recommended use of Vaseline for dryness around the eyelid if needed. - Instructed to report if symptoms do not improve.    Follow up: As needed No orders of the defined types were placed in this encounter.  Meds ordered this encounter  Medications   erythromycin  ophthalmic ointment    Sig: Place 1 Application into the right eye 3 (three) times daily.    Dispense:  1 g    Refill:  0     I reviewed the patients updated PMH, FH, and SocHx.  Patient Active Problem List   Diagnosis Date Noted   Avitaminosis D 07/14/2022   B12 deficiency 07/14/2022   Dyslipidemia 07/17/2018   OSA (obstructive sleep apnea) 09/06/2016   Peyronie disease 06/13/2016   Osteoarthritis of left knee 04/20/2014   Essential hypertension 05/25/2008   Insomnia 04/02/2007   Active Medications[1] Allergies: Patient is allergic to penicillins. Family History: Patient family history includes Hypertension in an other family member. Social History:  Patient  reports that he has never smoked. He has been exposed to tobacco smoke. He has never used smokeless tobacco. He reports current alcohol use of about 3.0 standard drinks of alcohol per week. He reports that he does not use drugs.  Review of Systems: Constitutional: Negative for fever malaise or anorexia Cardiovascular: negative for chest pain Respiratory: negative for SOB or persistent cough Gastrointestinal: negative for abdominal pain  Objective  Vitals: BP 118/70   Pulse (!) 55   Temp 98.4 F (36.9 C) (Temporal)   Resp 16   Ht 5' 10 (1.778 m)   Wt 183 lb 6.4 oz (83.2 kg)   SpO2 98%   BMI 26.32 kg/m  General: no acute distress , A&Ox3 HEENT: PEERL, conjunctiva with minimal erythema on right, upper eyelid red and dry without warmth or  drainage.  No foreign phobia Commons side effects, risks, benefits, and alternatives for medications and treatment plan prescribed today were discussed, and the patient expressed understanding of the given instructions. Patient is instructed to call or message via MyChart if he/she has any questions or concerns regarding our treatment plan. No barriers to understanding were identified. We discussed Red Flag symptoms and signs in detail. Patient expressed understanding regarding what to do in case of urgent or emergency type symptoms.  Medication list was reconciled, printed and provided to the patient in AVS. Patient instructions and summary information was reviewed with the patient as documented in the AVS. This note was prepared with assistance of Dragon voice recognition software. Occasional wrong-word or sound-a-like substitutions may have occurred due to the inherent limitations of voice recognition software    [1]  Current Meds  Medication Sig   amLODipine  (NORVASC ) 5 MG tablet Take 1 tablet (5 mg total) by mouth daily.   atorvastatin  (LIPITOR) 40 MG tablet Take 1 tablet (40 mg total) by mouth daily.   cyanocobalamin  1000 MCG tablet Take 1,000 mcg by mouth daily.   erythromycin  ophthalmic ointment Place 1 Application into the right eye 3 (three) times daily.   hydrocortisone  2.5 % cream Apply 1 application  topically daily as needed (itching).   lisinopril  (ZESTRIL ) 40 MG tablet Take 1 tablet (40 mg total) by mouth daily.   meloxicam (MOBIC) 15 MG tablet Take 15 mg by mouth daily.   traZODone  (DESYREL ) 100 MG tablet TAKE 1 & 1/2 (ONE & ONE-HALF) TABLETS BY MOUTH AT BEDTIME   Vitamin D , Ergocalciferol , (DRISDOL ) 1.25 MG (50000 UNIT) CAPS capsule Take 1 capsule (50,000 Units total) by mouth every 7 (seven) days.   "

## 2024-09-29 ENCOUNTER — Ambulatory Visit

## 2025-05-18 ENCOUNTER — Encounter: Admitting: Family Medicine
